# Patient Record
Sex: Female | Born: 1950 | Race: White | Hispanic: No | Smoking: Former smoker
Health system: Southern US, Community
[De-identification: ages and names within clinical notes are randomized; demographics above are authoritative.]

## PROBLEM LIST (undated history)

## (undated) DIAGNOSIS — I509 Heart failure, unspecified: Secondary | ICD-10-CM

## (undated) DIAGNOSIS — K7290 Hepatic failure, unspecified without coma: Secondary | ICD-10-CM

## (undated) DIAGNOSIS — J449 Chronic obstructive pulmonary disease, unspecified: Secondary | ICD-10-CM

## (undated) HISTORY — PX: TONSILLECTOMY: SUR1361

## (undated) HISTORY — PX: BLADDER SURGERY: SHX569

## (undated) HISTORY — PX: ABDOMINAL HYSTERECTOMY: SHX81

## (undated) HISTORY — PX: CHOLECYSTECTOMY: SHX55

## (undated) HISTORY — PX: BREAST SURGERY: SHX581

---

## 2018-04-21 ENCOUNTER — Encounter (HOSPITAL_BASED_OUTPATIENT_CLINIC_OR_DEPARTMENT_OTHER): Payer: Self-pay | Admitting: Emergency Medicine

## 2018-04-21 ENCOUNTER — Other Ambulatory Visit: Payer: Self-pay

## 2018-04-21 ENCOUNTER — Inpatient Hospital Stay (HOSPITAL_BASED_OUTPATIENT_CLINIC_OR_DEPARTMENT_OTHER)
Admission: EM | Admit: 2018-04-21 | Discharge: 2018-04-23 | DRG: 683 | Disposition: A | Discharge status: Home / Self Care | Payer: Managed Care, Other (non HMO) | Attending: Internal Medicine | Admitting: Internal Medicine

## 2018-04-21 ENCOUNTER — Emergency Department (HOSPITAL_BASED_OUTPATIENT_CLINIC_OR_DEPARTMENT_OTHER): Payer: Managed Care, Other (non HMO)

## 2018-04-21 DIAGNOSIS — I5032 Chronic diastolic (congestive) heart failure: Secondary | ICD-10-CM | POA: Diagnosis present

## 2018-04-21 DIAGNOSIS — R748 Abnormal levels of other serum enzymes: Secondary | ICD-10-CM

## 2018-04-21 DIAGNOSIS — J441 Chronic obstructive pulmonary disease with (acute) exacerbation: Secondary | ICD-10-CM

## 2018-04-21 DIAGNOSIS — D649 Anemia, unspecified: Secondary | ICD-10-CM | POA: Diagnosis present

## 2018-04-21 DIAGNOSIS — Z9049 Acquired absence of other specified parts of digestive tract: Secondary | ICD-10-CM

## 2018-04-21 DIAGNOSIS — Z90710 Acquired absence of both cervix and uterus: Secondary | ICD-10-CM | POA: Diagnosis not present

## 2018-04-21 DIAGNOSIS — Z9089 Acquired absence of other organs: Secondary | ICD-10-CM

## 2018-04-21 DIAGNOSIS — Z9981 Dependence on supplemental oxygen: Secondary | ICD-10-CM

## 2018-04-21 DIAGNOSIS — Z7951 Long term (current) use of inhaled steroids: Secondary | ICD-10-CM | POA: Diagnosis not present

## 2018-04-21 DIAGNOSIS — I5031 Acute diastolic (congestive) heart failure: Secondary | ICD-10-CM | POA: Diagnosis present

## 2018-04-21 DIAGNOSIS — Z87891 Personal history of nicotine dependence: Secondary | ICD-10-CM

## 2018-04-21 DIAGNOSIS — I110 Hypertensive heart disease with heart failure: Secondary | ICD-10-CM | POA: Diagnosis present

## 2018-04-21 DIAGNOSIS — K7460 Unspecified cirrhosis of liver: Secondary | ICD-10-CM | POA: Diagnosis present

## 2018-04-21 DIAGNOSIS — R21 Rash and other nonspecific skin eruption: Secondary | ICD-10-CM | POA: Diagnosis present

## 2018-04-21 DIAGNOSIS — I50814 Right heart failure due to left heart failure: Secondary | ICD-10-CM | POA: Diagnosis present

## 2018-04-21 DIAGNOSIS — J449 Chronic obstructive pulmonary disease, unspecified: Secondary | ICD-10-CM | POA: Diagnosis present

## 2018-04-21 DIAGNOSIS — L299 Pruritus, unspecified: Secondary | ICD-10-CM | POA: Diagnosis present

## 2018-04-21 DIAGNOSIS — E860 Dehydration: Secondary | ICD-10-CM | POA: Diagnosis present

## 2018-04-21 DIAGNOSIS — Z79899 Other long term (current) drug therapy: Secondary | ICD-10-CM

## 2018-04-21 DIAGNOSIS — N183 Chronic kidney disease, stage 3 unspecified: Secondary | ICD-10-CM | POA: Diagnosis present

## 2018-04-21 DIAGNOSIS — N179 Acute kidney failure, unspecified: Secondary | ICD-10-CM | POA: Diagnosis present

## 2018-04-21 DIAGNOSIS — R1010 Upper abdominal pain, unspecified: Secondary | ICD-10-CM

## 2018-04-21 DIAGNOSIS — M24549 Contracture, unspecified hand: Secondary | ICD-10-CM | POA: Diagnosis present

## 2018-04-21 DIAGNOSIS — K5730 Diverticulosis of large intestine without perforation or abscess without bleeding: Secondary | ICD-10-CM | POA: Diagnosis present

## 2018-04-21 DIAGNOSIS — Z888 Allergy status to other drugs, medicaments and biological substances status: Secondary | ICD-10-CM | POA: Diagnosis not present

## 2018-04-21 DIAGNOSIS — I272 Pulmonary hypertension, unspecified: Secondary | ICD-10-CM | POA: Diagnosis present

## 2018-04-21 DIAGNOSIS — K7581 Nonalcoholic steatohepatitis (NASH): Secondary | ICD-10-CM | POA: Diagnosis present

## 2018-04-21 DIAGNOSIS — K449 Diaphragmatic hernia without obstruction or gangrene: Secondary | ICD-10-CM | POA: Diagnosis present

## 2018-04-21 HISTORY — DX: Hepatic failure, unspecified without coma: K72.90

## 2018-04-21 HISTORY — DX: Chronic obstructive pulmonary disease, unspecified: J44.9

## 2018-04-21 HISTORY — DX: Heart failure, unspecified: I50.9

## 2018-04-21 LAB — COMPREHENSIVE METABOLIC PANEL
ALK PHOS: 92 U/L (ref 38–126)
ALT: 82 U/L — ABNORMAL HIGH (ref 0–44)
ANION GAP: 14 (ref 5–15)
AST: 96 U/L — ABNORMAL HIGH (ref 15–41)
Albumin: 4 g/dL (ref 3.5–5.0)
BUN: 26 mg/dL — ABNORMAL HIGH (ref 8–23)
CALCIUM: 10.2 mg/dL (ref 8.9–10.3)
CO2: 38 mmol/L — ABNORMAL HIGH (ref 22–32)
Chloride: 86 mmol/L — ABNORMAL LOW (ref 98–111)
Creatinine, Ser: 1.97 mg/dL — ABNORMAL HIGH (ref 0.44–1.00)
GFR calc non Af Amer: 25 mL/min — ABNORMAL LOW (ref >60)
GFR, EST AFRICAN AMERICAN: 30 mL/min — AB (ref >60)
Glucose, Bld: 121 mg/dL — ABNORMAL HIGH (ref 70–99)
Potassium: 3.5 mmol/L (ref 3.5–5.1)
Sodium: 138 mmol/L (ref 135–145)
Total Bilirubin: 1.1 mg/dL (ref 0.3–1.2)
Total Protein: 7.1 g/dL (ref 6.5–8.1)

## 2018-04-21 LAB — URINALYSIS, ROUTINE W REFLEX MICROSCOPIC
BILIRUBIN URINE: NEGATIVE
GLUCOSE, UA: NEGATIVE mg/dL
Ketones, ur: NEGATIVE mg/dL
Leukocytes, UA: NEGATIVE
Nitrite: NEGATIVE
PROTEIN: NEGATIVE mg/dL
Specific Gravity, Urine: 1.01 (ref 1.005–1.030)
pH: 7 (ref 5.0–8.0)

## 2018-04-21 LAB — PROTIME-INR
INR: 1.12
PROTHROMBIN TIME: 14.3 s (ref 11.4–15.2)

## 2018-04-21 LAB — CBC WITH DIFFERENTIAL/PLATELET
Abs Immature Granulocytes: 0.06 10*3/uL (ref 0.00–0.07)
Basophils Absolute: 0.1 10*3/uL (ref 0.0–0.1)
Basophils Relative: 1 %
EOS PCT: 13 %
Eosinophils Absolute: 1.2 10*3/uL — ABNORMAL HIGH (ref 0.0–0.5)
HEMATOCRIT: 35.8 % — AB (ref 36.0–46.0)
HEMOGLOBIN: 11.8 g/dL — AB (ref 12.0–15.0)
Immature Granulocytes: 1 %
LYMPHS ABS: 1 10*3/uL (ref 0.7–4.0)
LYMPHS PCT: 11 %
MCH: 33.3 pg (ref 26.0–34.0)
MCHC: 33 g/dL (ref 30.0–36.0)
MCV: 101.1 fL — ABNORMAL HIGH (ref 80.0–100.0)
MONO ABS: 0.5 10*3/uL (ref 0.1–1.0)
Monocytes Relative: 6 %
Neutro Abs: 6.4 10*3/uL (ref 1.7–7.7)
Neutrophils Relative %: 68 %
Platelets: 190 10*3/uL (ref 150–400)
RBC: 3.54 MIL/uL — AB (ref 3.87–5.11)
RDW: 13.4 % (ref 11.5–15.5)
WBC: 9.3 10*3/uL (ref 4.0–10.5)
nRBC: 0 % (ref 0.0–0.2)

## 2018-04-21 LAB — LIPASE, BLOOD: Lipase: 684 U/L — ABNORMAL HIGH (ref 11–51)

## 2018-04-21 LAB — MAGNESIUM: Magnesium: 1.8 mg/dL (ref 1.7–2.4)

## 2018-04-21 LAB — URINALYSIS, MICROSCOPIC (REFLEX)

## 2018-04-21 LAB — AMMONIA: AMMONIA: 35 umol/L (ref 9–35)

## 2018-04-21 MED ORDER — SODIUM CHLORIDE 0.9 % IV BOLUS
1000.0000 mL | Freq: Once | INTRAVENOUS | Status: AC
  Administered 2018-04-21: 1000 mL via INTRAVENOUS

## 2018-04-21 MED ORDER — DIPHENHYDRAMINE HCL 50 MG/ML IJ SOLN
25.0000 mg | Freq: Once | INTRAMUSCULAR | Status: AC
  Administered 2018-04-21: 25 mg via INTRAVENOUS
  Filled 2018-04-21: qty 1

## 2018-04-21 MED ORDER — ONDANSETRON HCL 4 MG PO TABS: 4.0000 mg | ORAL_TABLET | Freq: Four times a day (QID) | ORAL | Status: DC | PRN

## 2018-04-21 MED ORDER — ONDANSETRON HCL 4 MG/2ML IJ SOLN: 4.0000 mg | Freq: Four times a day (QID) | INTRAMUSCULAR | Status: DC | PRN

## 2018-04-21 MED ORDER — HYDROXYZINE HCL 25 MG PO TABS
25.0000 mg | ORAL_TABLET | Freq: Three times a day (TID) | ORAL | Status: DC | PRN
  Administered 2018-04-22 (×2): 25 mg via ORAL
  Filled 2018-04-21 (×2): qty 1

## 2018-04-21 MED ORDER — METOPROLOL TARTRATE 25 MG PO TABS
25.0000 mg | ORAL_TABLET | Freq: Every day | ORAL | Status: DC
  Administered 2018-04-22 – 2018-04-23 (×2): 25 mg via ORAL
  Filled 2018-04-21 (×3): qty 1

## 2018-04-21 MED ORDER — HYDROXYZINE HCL 10 MG PO TABS
10.0000 mg | ORAL_TABLET | Freq: Once | ORAL | Status: DC
  Filled 2018-04-21: qty 1

## 2018-04-21 MED ORDER — MOMETASONE FURO-FORMOTEROL FUM 200-5 MCG/ACT IN AERO
2.0000 | INHALATION_SPRAY | Freq: Two times a day (BID) | RESPIRATORY_TRACT | Status: DC
  Administered 2018-04-22 – 2018-04-23 (×3): 2 via RESPIRATORY_TRACT
  Filled 2018-04-21: qty 8.8

## 2018-04-21 MED ORDER — LACTATED RINGERS IV SOLN
INTRAVENOUS | Status: DC
  Administered 2018-04-21: 18:00:00 via INTRAVENOUS

## 2018-04-21 MED ORDER — GABAPENTIN 400 MG PO CAPS
400.0000 mg | ORAL_CAPSULE | Freq: Three times a day (TID) | ORAL | Status: DC
  Administered 2018-04-21 – 2018-04-23 (×5): 400 mg via ORAL
  Filled 2018-04-21 (×5): qty 1

## 2018-04-21 MED ORDER — UMECLIDINIUM BROMIDE 62.5 MCG/INH IN AEPB
1.0000 | INHALATION_SPRAY | Freq: Every day | RESPIRATORY_TRACT | Status: DC
  Administered 2018-04-22 – 2018-04-23 (×2): 1 via RESPIRATORY_TRACT
  Filled 2018-04-21: qty 7

## 2018-04-21 MED ORDER — HEPARIN SODIUM (PORCINE) 5000 UNIT/ML IJ SOLN
5000.0000 [IU] | Freq: Three times a day (TID) | INTRAMUSCULAR | Status: DC
  Administered 2018-04-21 – 2018-04-23 (×5): 5000 [IU] via SUBCUTANEOUS
  Filled 2018-04-21 (×5): qty 1

## 2018-04-21 MED ORDER — HYDROXYZINE HCL 25 MG PO TABS
25.0000 mg | ORAL_TABLET | Freq: Once | ORAL | Status: AC
  Administered 2018-04-21: 25 mg via ORAL
  Filled 2018-04-21: qty 1

## 2018-04-21 MED ORDER — SODIUM CHLORIDE 0.9 % IV SOLN
INTRAVENOUS | Status: DC
  Administered 2018-04-21: 23:00:00 via INTRAVENOUS

## 2018-04-21 NOTE — ED Triage Notes (Signed)
Rash on back x1 week with severe itching.  Saw pmd, has been using the usual allergic reaction meds and was starting to get better. For past 3 days it has re-ignited and has spread to other parts of body.

## 2018-04-21 NOTE — ED Provider Notes (Signed)
[facility] EMERGENCY DEPARTMENT Provider Note   CSN: [id number] Arrival date & time: 04/21/18  1152     History   Chief Complaint Chief Complaint  Patient presents with  . Rash    HPI Alyssa Mckenzie is a 67 y.o. female.  The history is provided by the patient and medical records. No language interpreter was used.  Abdominal Pain   This is a chronic problem. The current episode started more than 1 week ago. The problem occurs constantly. The problem has not changed since onset.The pain is associated with an unknown factor. The pain is located in the RUQ, epigastric region and LUQ. The quality of the pain is aching. The pain is moderate. Associated symptoms include nausea. Pertinent negatives include fever, diarrhea, vomiting, constipation, dysuria and headaches. The symptoms are aggravated by palpation. Nothing relieves the symptoms.    Past Medical History:  Diagnosis Date  . CHF (congestive heart failure) (HCC)   . COPD (chronic obstructive pulmonary disease) (HCC)   . Liver failure (HCC)     There are no active problems to display for this patient.   Past Surgical History:  Procedure Laterality Date  . ABDOMINAL HYSTERECTOMY    . BLADDER SURGERY    . BREAST SURGERY    . CHOLECYSTECTOMY    . TONSILLECTOMY       OB History   None      Home Medications    Prior to Admission medications   Medication Sig Start Date End Date Taking? Authorizing Provider  albuterol (PROVENTIL HFA;VENTOLIN HFA) 108 (90 Base) MCG/ACT inhaler Inhale into the lungs every 6 (six) hours as needed for wheezing or shortness of breath.   Yes [provider]  fluticasone-salmeterol (ADVAIR HFA) 115-21 MCG/ACT inhaler Inhale 2 puffs into the lungs 2 (two) times daily.   Yes [provider]  furosemide (LASIX) 20 MG tablet Take 20 mg by mouth daily.   Yes [provider]  gabapentin (NEURONTIN) 300 MG capsule Take 400 mg by mouth 3 (three) times daily.  600mg  at night   Yes [provider]  metoprolol tartrate (LOPRESSOR) 25 MG tablet Take 25 mg by mouth daily.   Yes [provider]  umeclidinium bromide (INCRUSE ELLIPTA) 62.5 MCG/INH AEPB Inhale 1 puff into the lungs daily.   Yes [provider]    Family History No family history on file.  Social History Social History   Tobacco Use  . Smoking status: Former Games developer  . Smokeless tobacco: Never Used  Substance Use Topics  . Alcohol use: Yes    Alcohol/week: 4.0 standard drinks    Types: 4 Glasses of wine per week    Comment: 4 glasses/day  . Drug use: Never     Allergies   Patient has no known allergies.   Review of Systems Review of Systems  Constitutional: Positive for fatigue. Negative for chills, diaphoresis and fever.  HENT: Negative for congestion.   Eyes: Negative for visual disturbance.  Respiratory: Negative for cough, chest tightness, shortness of breath and wheezing.   Cardiovascular: Negative for chest pain, palpitations and leg swelling.  Gastrointestinal: Positive for abdominal pain and nausea. Negative for abdominal distention, constipation, diarrhea and vomiting.  Genitourinary: Negative for dysuria and flank pain.  Musculoskeletal: Negative for back pain, neck pain and neck stiffness.  Skin: Positive for rash.       Profound itching   Neurological: Negative for light-headedness and headaches.  Psychiatric/Behavioral: Negative for agitation.  All other systems reviewed  and are negative.    Physical Exam Updated Vital Signs BP (!) 453/77 (BP Location: Right Arm)   Pulse 89   Temp 98.4 F (36.9 C) (Oral)   Resp 20   Ht 5\' 2"  (1.575 m)   Wt 62.1 kg   SpO2 100%   BMI 25.06 kg/m   Physical Exam  Constitutional: She is oriented to person, place, and time. She appears well-developed and well-nourished. No distress.  HENT:  Head: Normocephalic and atraumatic.  Mouth/Throat: Oropharynx is clear and moist. No  oropharyngeal exudate.  Eyes: Pupils are equal, round, and reactive to light. Conjunctivae and EOM are normal. No scleral icterus.  Neck: Normal range of motion. Neck supple.  Cardiovascular: Normal rate, regular rhythm and intact distal pulses.  No murmur heard. Pulmonary/Chest: Effort normal and breath sounds normal. No respiratory distress. She has no wheezes. She has no rales. She exhibits no tenderness.  Abdominal: Soft. There is tenderness in the right upper quadrant, epigastric area and left upper quadrant.    Musculoskeletal: She exhibits no edema or tenderness.  Neurological: She is alert and oriented to person, place, and time. No cranial nerve deficit or sensory deficit. She exhibits normal muscle tone. Coordination normal.  Skin: Skin is warm and dry. Capillary refill takes less than 2 seconds. Ecchymosis and rash noted. Rash is maculopapular. She is not diaphoretic. There is erythema.  Patient has diffuse maculopapular rash on her back and torso.  Bruising on her hands and forearms due to significant scratching and itching.  Psychiatric: She has a normal mood and affect.  Nursing note and vitals reviewed.    ED Treatments / Results  Labs (all labs ordered are listed, but only abnormal results are displayed) Labs Reviewed  CBC WITH DIFFERENTIAL/PLATELET - Abnormal; Notable for the following components:      Result Value   RBC 3.54 (*)    Hemoglobin 11.8 (*)    HCT 35.8 (*)    MCV 101.1 (*)    Eosinophils Absolute 1.2 (*)    All other components within normal limits  COMPREHENSIVE METABOLIC PANEL - Abnormal; Notable for the following components:   Chloride 86 (*)    CO2 38 (*)    Glucose, Bld 121 (*)    BUN 26 (*)    Creatinine, Ser 1.97 (*)    AST 96 (*)    ALT 82 (*)    GFR calc non Af Amer 25 (*)    GFR calc Af Amer 30 (*)    All other components within normal limits  URINALYSIS, ROUTINE W REFLEX MICROSCOPIC - Abnormal; Notable for the following components:    Color, Urine STRAW (*)    Hgb urine dipstick TRACE (*)    All other components within normal limits  LIPASE, BLOOD - Abnormal; Notable for the following components:   Lipase 684 (*)    All other components within normal limits  URINALYSIS, MICROSCOPIC (REFLEX) - Abnormal; Notable for the following components:   Bacteria, UA RARE (*)    All other components within normal limits  URINE CULTURE  AMMONIA  PROTIME-INR  MAGNESIUM    EKG None  Radiology No results found.  Procedures Procedures (including critical care time)  Medications Ordered in ED Medications  sodium chloride 0.9 % bolus 1,000 mL (1,000 mLs Intravenous New Bag/Given 04/21/18 1301)     Initial Impression / Assessment and Plan / ED Course  I have reviewed the triage vital signs and the nursing notes.  Pertinent labs &  imaging results that were available during my care of the patient were reviewed by me and considered in my medical decision making (see chart for details).     Debbe OdeaCynthia Howald is a [age] y.o. female with a reported past medical history significant for CHF, cirrhosis/liver failure, and COPD on home oxygen who presents with severe itching and diffuse rash.  Patient reports that she uses a TENS unit on her back as managed by her chiropractor.  She says that she has had irritation from this in the past but last Thursday, 6 days ago, she had onset of rash that was on her back.  She reports she saw her PCP who started her on a steroid taper of prednisone.  She also has been taking antihistamines.  She says that she is now down to the lowest dose of prednisone and over the last 24 hours her rashes continue to worsen.  She said is now spread all across her back and onto the rest of her torso.  She reports the itching is severe and she is scratching all over.  She thought yesterday her throat felt swollen but that has resolved. Mild nausea.  She denies any shortness of breath over her baseline.  She denies any cough  over baseline.  She denies vomiting, conservation, diarrhea, or urinary symptoms.  She reports similar abdominal discomfort.  Patient continues to drink wine.  She denies any significant relief from the antihistamines or steroids and her itching is incessant preventing her from sleeping.  On exam, patient has a maculopapular rash across her entire back that is blanching.  Patient also has varicose-like rash on her chest.  She has no significant tenderness.  Patient has focus of rash at the site of the TENS unit and she thinks it could be related to the sticky gel used.  She had lungs that were clear with no significant wheezing and is on her home oxygen.  Abdomen is slightly tender in the right upper quadrant.  No significant lower extremity edema seen.  Normal sensation in extremity's.  Patient has ecchymosis and bruising on the dorsums of both of her arms which she reports is due to severe scratching.  Initial differential includes allergic reaction due to the TENS unit adhesive that in the setting of continuing her prednisone taper has worsened.  However given her history of liver disease and continued drinking and kidney disease, itching could be due to metabolic imbalance.  Patient will have laboratory testing including liver function INR, ammonia, and urinalysis to look for kidney function.  She did not want chest x-ray as she reports she has had no new shortness breath or cough.  Patient was given some fluids as she feels her mouth is dry and she feels dehydrated.  Her legs are nonedematous and I do not hear crackles in the lungs, doubt CHF exacerbation.  If labs are reassuring, anticipate increasing steroids and follow-up with allergist and PCP.  If labs are concerning, will consider further management.  2:10 PM Diagnostic labs begin to return.  Urinalysis is no evidence of urinary tract infection no nitrites or leukocytes.  CBC shows no leukocytosis.  Mild anemia.  Normal platelets.  Metabolic  panel was highly concerning with elevated creatinine of 1.97.  No prior creatinine to compare recently.  Also concerning is patient's AST and ALT that are elevated and the lipase is 684.  When asked, family and patient do not know if she has ever had lipase checked.  Given the elevated LFTs  and the lipase elevation, a cholestasis or obstructive etiology could be the cause of both her itching and the upper abdominal discomfort that has been persistent.    Patient's kidney function is elevated and she cannot get a contrasted scan so we will do an ultrasound to look for sludge, stone, or cholecystitis.  If ultrasound is negative, will consider CT without contrast to further evaluate.  If patient is having pancreatitis causing dehydration and AKI and profound itching, patient may require admission for rehydration and further management.  If imaging is all reassuring and patient feels comfortable, patient may be discharged home with increase in steroids as her itching may be related to the rash on her back.  Care transferred to Dr. Jacqulyn Bath while awaiting results of ultrasound.  Anticipate reassessment after work-up.   Final Clinical Impressions(s) / ED Diagnoses   Final diagnoses:  Upper abdominal pain  Rash  Pruritus  Elevated lipase     Clinical Impression: 1. Rash   2. Upper abdominal pain   3. Pruritus   4. Elevated lipase     Disposition: Care transferred to Dr. Jacqulyn Bath while awaiting results of ultrasound and rehydration.  This note was prepared with assistance of Conservation officer, historic buildings. Occasional wrong-word or sound-a-like substitutions may have occurred due to the inherent limitations of voice recognition software.     Adiva Boettner, Canary Brim, MD 04/21/18 [phone]

## 2018-04-21 NOTE — H&P (Signed)
History and Physical   Alyssa OdeaCynthia Kundrat JYN:[id number] DOB: June 18, 1950 DOA: 04/21/2018  Referring MD/NP/PA: Dr. Jacqulyn BathLong  PCP: Patrcia DollyMyers, Daniel Lewis, DO   Outpatient Specialists: [facility]  Patient coming from: [facility]  Chief Complaint: Itching and generalized weakness  HPI: Alyssa Mckenzie is a 67 y.o. 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  She went to see her primary care physician a few days ago with the same problem.  While there she reported using some TENS unit for her chronic back pain and started feeling the itching after she removed the TENS unit.  She noticed a lot of rashes going down her body which are raised and blanching.  Associated with that is her increasing shortness of breath and abdominal pain.  Her initial evaluation showed worsening renal function with a lipase of more than 600 per subsequent abdominal ultrasound and CT scan showed no evidence of pancreatitis.  Patient denied any recent nausea vomiting or diarrhea.  She is being admitted primarily because of the worsening renal function and to be evaluated for worsening itching.  ED Course: Temperature is 98 for initial blood pressure 124/54 her pulse is 91 respiratory 21 oxygen sat 100%.  Sodium 138 potassium 3.5 chloride 86 with a CO2 38 and glucose 121.  Creatinine is 1.97 calcium 10.2 with a gap of 14.  Her lipase is 684.  AST 96 ALT 82 total protein 7.1 albumin is 4.0 and total bilirubin 1.1.  Hemoglobin 11.8 and platelets 190.  Urinalysis is negative.  CT abdomen pelvis showed hepatic cirrhosis and diffuse hepatic steatosis.  No other significant findings except for diverticulosis and hiatal hernia.  Abdominal ultrasound also showed status post cholecystectomy otherwise  negative.  Review of Systems: As per HPI otherwise 10 point review of systems negative.    Past Medical History:  Diagnosis Date  . CHF (congestive heart failure) (HCC)   . COPD (chronic obstructive pulmonary disease) (HCC)   . Liver failure [facility](HCC)     Past Surgical History:  Procedure Laterality Date  . ABDOMINAL HYSTERECTOMY    . BLADDER SURGERY    . BREAST SURGERY    . CHOLECYSTECTOMY    . TONSILLECTOMY       reports that she has quit smoking. She has never used smokeless tobacco. She reports that she drinks about 4.0 standard drinks of alcohol per week. She reports that she does not use drugs.  No Known Allergies  No family history on file.   Prior to Admission medications   Medication Sig Start Date End Date Taking? Authorizing Provider  albuterol (PROVENTIL HFA;VENTOLIN HFA) 108 (90 Base) MCG/ACT inhaler Inhale into the lungs every 6 (six) hours as needed for wheezing or shortness of breath.   Yes [provider]  fluticasone-salmeterol (ADVAIR HFA) 115-21 MCG/ACT inhaler Inhale 2 puffs into the lungs 2 (two) times daily.   Yes [provider]  furosemide (LASIX) 20 MG tablet Take 20 mg by mouth daily.   Yes [provider]  gabapentin (NEURONTIN) 300 MG capsule Take 400 mg by mouth 3 (three) times daily. 600mg  at night   Yes [provider]  metoprolol tartrate (LOPRESSOR) 25 MG tablet Take 25 mg by mouth daily.   Yes [provider]  umeclidinium bromide (INCRUSE ELLIPTA) 62.5 MCG/INH AEPB Inhale 1 puff into  the lungs daily.   Yes [provider]    Physical Exam: Vitals:   04/21/18 1912 04/21/18 1945 04/21/18 2055 04/21/18 2056  BP:  125/60 (!) 143/64   Pulse:  91 79   Resp: 18 (!) 21 16   Temp:   98 F (36.7 C)   TempSrc:   Oral   SpO2:  95% 100%   Weight:    60.7 kg  Height:    5\' 2"  (1.575 m)      Constitutional: NAD, calm, comfortable, appears stated age Vitals:   04/21/18 1912 04/21/18 1945  04/21/18 2055 04/21/18 2056  BP:  125/60 (!) 143/64   Pulse:  91 79   Resp: 18 (!) 21 16   Temp:   98 F (36.7 C)   TempSrc:   Oral   SpO2:  95% 100%   Weight:    60.7 kg  Height:    5\' 2"  (1.575 m)   Eyes: PERRL, lids and conjunctivae normal ENMT: Mucous membranes are moist. Posterior pharynx clear of any exudate or lesions.Normal dentition.  Neck: normal, supple, no masses, no thyromegaly Respiratory: clear to auscultation bilaterally, no wheezing, no crackles. Normal respiratory effort. No accessory muscle use.  Cardiovascular: Regular rate and rhythm, no murmurs / rubs / gallops. No extremity edema. 2+ pedal pulses. No carotid bruits.  Abdomen: Full and mildly distended, no tenderness, no masses palpated. No hepatosplenomegaly. Bowel sounds positive.  Musculoskeletal: no clubbing / cyanosis. No joint deformity upper and lower extremities. Good ROM, some hand contractures. Normal muscle tone.  Skin: Multiple punctate rashes all over the trunk as well as bruises on the arms, lesions, ulcers. No induration Neurologic: CN 2-12 grossly intact. Sensation intact, DTR normal. Strength 5/5 in all 4.  Psychiatric: Normal judgment and insight. Alert and oriented x 3. Normal mood.     Labs on Admission: I have personally reviewed following labs and imaging studies  CBC: Recent Labs  Lab 04/21/18 1243  WBC 9.3  NEUTROABS 6.4  HGB 11.8*  HCT 35.8*  MCV 101.1*  PLT 190   Basic Metabolic Panel: Recent Labs  Lab 04/21/18 1243  NA 138  K 3.5  CL 86*  CO2 38*  GLUCOSE 121*  BUN 26*  CREATININE 1.97*  CALCIUM 10.2  MG 1.8   GFR: Estimated Creatinine Clearance: 24.4 mL/min (A) (by C-G formula based on SCr of 1.97 mg/dL (H)). Liver Function Tests: Recent Labs  Lab 04/21/18 1243  AST 96*  ALT 82*  ALKPHOS 92  BILITOT 1.1  PROT 7.1  ALBUMIN 4.0   Recent Labs  Lab 04/21/18 1243  LIPASE 684*   Recent Labs  Lab 04/21/18 1243  AMMONIA 35   Coagulation  Profile: Recent Labs  Lab 04/21/18 1243  INR 1.12   Cardiac Enzymes: No results for input(s): CKTOTAL, CKMB, CKMBINDEX, TROPONINI in the last 168 hours. BNP (last 3 results) No results for input(s): PROBNP in the last 8760 hours. HbA1C: No results for input(s): HGBA1C in the last 72 hours. CBG: No results for input(s): GLUCAP in the last 168 hours. Lipid Profile: No results for input(s): CHOL, HDL, LDLCALC, TRIG, CHOLHDL, LDLDIRECT in the last 72 hours. Thyroid Function Tests: No results for input(s): TSH, T4TOTAL, FREET4, T3FREE, THYROIDAB in the last 72 hours. Anemia Panel: No results for input(s): VITAMINB12, FOLATE, FERRITIN, TIBC, IRON, RETICCTPCT in the last 72 hours. Urine analysis:    Component Value Date/Time   COLORURINE STRAW (A) 04/21/2018 1302   APPEARANCEUR CLEAR 04/21/2018  1302   LABSPEC 1.010 04/21/2018 1302   PHURINE 7.0 04/21/2018 1302   GLUCOSEU NEGATIVE 04/21/2018 1302   HGBUR TRACE (A) 04/21/2018 1302   BILIRUBINUR NEGATIVE 04/21/2018 1302   KETONESUR NEGATIVE 04/21/2018 1302   PROTEINUR NEGATIVE 04/21/2018 1302   NITRITE NEGATIVE 04/21/2018 1302   LEUKOCYTESUR NEGATIVE 04/21/2018 1302   Sepsis Labs: @LABRCNTIP (procalcitonin:4,lacticidven:4) )No results found for this or any previous visit (from the past 240 hour(s)).   Radiological Exams on Admission: Ct Abdomen Pelvis Wo Contrast  Result Date: 04/21/2018 CLINICAL DATA:  Nausea and vomiting.  Upper abdominal pain. EXAM: CT ABDOMEN AND PELVIS WITHOUT CONTRAST TECHNIQUE: Multidetector CT imaging of the abdomen and pelvis was performed following the standard protocol without IV contrast. COMPARISON:  None. FINDINGS: Lower chest: No significant pulmonary nodules or acute consolidative airspace disease. Centrilobular emphysema at the lung bases. Hepatobiliary: Diffuse hepatic steatosis. Heterogeneous liver parenchyma and finely irregular liver surface, compatible with hepatic cirrhosis. No discrete liver  mass. Cholecystectomy. No biliary ductal dilatation. Pancreas: Normal, with no mass or duct dilation. Spleen: Normal size. No mass. Adrenals/Urinary Tract: Normal adrenals. No hydronephrosis. No renal stones. No contour deforming renal masses. Normal bladder. Stomach/Bowel: Small hiatal hernia. Otherwise normal nondistended stomach. Normal caliber small bowel with no small bowel wall thickening. Normal appendix. Moderate sigmoid diverticulosis, with no large bowel wall thickening or significant pericolonic fat stranding. Vascular/Lymphatic: Atherosclerotic nonaneurysmal abdominal aorta. No pathologically enlarged lymph nodes in the abdomen or pelvis. Reproductive: Status post hysterectomy, with no abnormal findings at the vaginal cuff. No adnexal mass. Other: No pneumoperitoneum, ascites or focal fluid collection. Musculoskeletal: No aggressive appearing focal osseous lesions. Moderate lumbar spondylosis. IMPRESSION: 1. Hepatic cirrhosis and diffuse hepatic steatosis. 2. No evidence of bowel obstruction or acute bowel inflammation. Moderate sigmoid diverticulosis, with no evidence of acute diverticulitis. 3. Small hiatal hernia. 4. Aortic Atherosclerosis (ICD10-I70.0) and Emphysema (ICD10-J43.9). Electronically Signed   By: Delbert Phenix M.D.   On: 04/21/2018 17:20   US Abdomen Limited Ruq  Result Date: 04/21/2018 CLINICAL DATA:  Upper abdominal pain, elevated LFTs and lipase, history CHF, COPD, liver failure, former smoker, history of gallstones and prior cholecystectomy EXAM: ULTRASOUND ABDOMEN LIMITED RIGHT UPPER QUADRANT COMPARISON:  None FINDINGS: Gallbladder: Surgically absent Common bile duct: Diameter: 4 mm diameter, normal Liver: Heterogeneous increased hepatic echogenicity, question fatty infiltration though this can be seen with cirrhosis and certain infiltrative disorders. Hepatic margins appear smooth without irregularity or nodularity. No definite focal hepatic mass lesion identified. Portal vein  is patent on color Doppler imaging with normal direction of blood flow towards the liver. No RIGHT upper quadrant free fluid. IMPRESSION: Post cholecystectomy. Question fatty infiltration of liver as above. Electronically Signed   By: Ulyses Southward M.D.   On: 04/21/2018 16:17    Assessment/Plan Principal Problem:   ARF (acute renal failure) (HCC) Active Problems:   COPD exacerbation (HCC)   Acute diastolic HF (heart failure) (HCC)   Liver cirrhosis (HCC)   Itching     #1 acute kidney injury: Patient's GFR and creatinine have changed within a few days.  Previous creatinine was 0.8 and currently 1.97 with a GFR now down to 25 but was 60 about a week ago.  It appears to be prerenal based on her profile.  Did not change any medications.  We will therefore admit the patient.  Gently hydrate her and stop her Lasix.  She has been taking Lasix on a daily basis.  Monitor her response and avoid fluid overload.  If renal  function does not improve we may require nephrology input.  #2 itching with rashes: Probably related to liver cirrhosis.  Patient has no new medications.  I do not think using the TENS unit had anything to do with it.  We will treated symptomatically.  Start patient on some Atarax p.o.  Cholestyramine may be added to see if that would help.  She has already tried steroids in the outpatient setting with minimal help.  #3 COPD: No obvious exacerbation.  On home O2.  Continue home regimen.  #4 liver cirrhosis: Appears to be nonalcoholic steatosis which has been present for a while.  Elevation in LFTs will therefore be secondary to these.  Continue supportive care.  #5 diastolic heart failure: Patient apparently has been diagnosed with right-sided heart failure many years ago but has not really been followed up for that.  I will order an echocardiogram.   DVT prophylaxis: Heparin Code Status: Full code Family Communication: No family currently available Disposition Plan: Home Consults  called: None at the moment Admission status: Inpatient  Severity of Illness: The appropriate patient status for this patient is INPATIENT. Inpatient status is judged to be reasonable and necessary in order to provide the required intensity of service to ensure the patient's safety. The patient's presenting symptoms, physical exam findings, and initial radiographic and laboratory data in the context of their chronic comorbidities is felt to place them at high risk for further clinical deterioration. Furthermore, it is not anticipated that the patient will be medically stable for discharge from the hospital within 2 midnights of admission. The following factors support the patient status of inpatient.   " The patient's presenting symptoms include abdominal pain and itching. " The worrisome physical exam findings include overall skin rashes. " The initial radiographic and laboratory data are worrisome because of worsening renal function. " The chronic co-morbidities include liver cirrhosis and COPD.   * I certify that at the point of admission it is my clinical judgment that the patient will require inpatient hospital care spanning beyond 2 midnights from the point of admission due to high intensity of service, high risk for further deterioration and high frequency of surveillance required.Lonia Blood MD [facility] Pager [phone]  If 7PM-7AM, please contact night-coverage [email or web address] Password [email or web address]  04/21/2018, 10:02 PM

## 2018-04-21 NOTE — ED Provider Notes (Signed)
Blood pressure (!) 147/71, pulse 79, temperature 98.4 F (36.9 C), temperature source Oral, resp. rate 20, height 5\' 2"  (1.575 m), weight 62.1 kg, SpO2 100 %.  Assuming care from Dr. Rush Landmarkegeler.  In short, Debbe OdeaCynthia Leonhardt is a 67 y.o. female with a chief complaint of Rash .  Refer to the original H&P for additional details.  The current plan of care is to f/u RUQ US and labs.  5:28 PM Patient presents with epigastric abdominal pain with severe itching and rash.  Labs show elevated lipase to 600 and worsening kidney function.  GFR now in the mid 20s.  Last week was in the upper 40s to 50s.  CT imaging and right upper quadrant ultrasound did not show any acute biliary process.  Patient does have significant liver disease likely secondary to cirrhosis.  Given her known pathology and worsening kidney function I feel she would benefit from observational admission for IV fluids and possibly nephrology evaluation.   06:21 PM Discussed patient's case with Hospitalist, Dr. Mikeal HawthorneGarba to request admission. Patient and family (if present) updated with plan. Care transferred to Hospitalist service.  I reviewed all nursing notes, vitals, pertinent old records, EKGs, labs, imaging (as available).    Maia PlanLong, Nahsir Venezia G, MD 04/21/18 [id number]

## 2018-04-21 NOTE — ED Notes (Signed)
ED Provider at bedside. 

## 2018-04-22 ENCOUNTER — Inpatient Hospital Stay (HOSPITAL_COMMUNITY): Payer: Managed Care, Other (non HMO)

## 2018-04-22 DIAGNOSIS — R21 Rash and other nonspecific skin eruption: Secondary | ICD-10-CM

## 2018-04-22 DIAGNOSIS — K7460 Unspecified cirrhosis of liver: Secondary | ICD-10-CM

## 2018-04-22 LAB — CBC
HEMATOCRIT: 33.2 % — AB (ref 36.0–46.0)
Hemoglobin: 10.7 g/dL — ABNORMAL LOW (ref 12.0–15.0)
MCH: 34.3 pg — AB (ref 26.0–34.0)
MCHC: 32.2 g/dL (ref 30.0–36.0)
MCV: 106.4 fL — AB (ref 80.0–100.0)
Platelets: 153 10*3/uL (ref 150–400)
RBC: 3.12 MIL/uL — ABNORMAL LOW (ref 3.87–5.11)
RDW: 13.6 % (ref 11.5–15.5)
WBC: 8.2 10*3/uL (ref 4.0–10.5)
nRBC: 0 % (ref 0.0–0.2)

## 2018-04-22 LAB — COMPREHENSIVE METABOLIC PANEL
ALT: 67 U/L — AB (ref 0–44)
AST: 58 U/L — ABNORMAL HIGH (ref 15–41)
Albumin: 3.7 g/dL (ref 3.5–5.0)
Alkaline Phosphatase: 75 U/L (ref 38–126)
Anion gap: 10 (ref 5–15)
BILIRUBIN TOTAL: 1 mg/dL (ref 0.3–1.2)
BUN: 29 mg/dL — AB (ref 8–23)
CALCIUM: 9.7 mg/dL (ref 8.9–10.3)
CO2: 37 mmol/L — AB (ref 22–32)
CREATININE: 1.25 mg/dL — AB (ref 0.44–1.00)
Chloride: 93 mmol/L — ABNORMAL LOW (ref 98–111)
GFR calc non Af Amer: 44 mL/min — ABNORMAL LOW (ref >60)
GFR, EST AFRICAN AMERICAN: 51 mL/min — AB (ref >60)
GLUCOSE: 88 mg/dL (ref 70–99)
Potassium: 2.7 mmol/L — CL (ref 3.5–5.1)
Sodium: 140 mmol/L (ref 135–145)
Total Protein: 6.3 g/dL — ABNORMAL LOW (ref 6.5–8.1)

## 2018-04-22 LAB — URINE CULTURE

## 2018-04-22 LAB — HIV ANTIBODY (ROUTINE TESTING W REFLEX): HIV Screen 4th Generation wRfx: NONREACTIVE

## 2018-04-22 MED ORDER — POTASSIUM CHLORIDE CRYS ER 20 MEQ PO TBCR
40.0000 meq | EXTENDED_RELEASE_TABLET | Freq: Two times a day (BID) | ORAL | Status: AC
  Administered 2018-04-22 (×2): 40 meq via ORAL
  Filled 2018-04-22 (×2): qty 2

## 2018-04-22 MED ORDER — CHOLESTYRAMINE LIGHT 4 G PO PACK
4.0000 g | PACK | Freq: Two times a day (BID) | ORAL | Status: DC
  Administered 2018-04-22 (×2): 4 g via ORAL
  Filled 2018-04-22 (×4): qty 1

## 2018-04-22 MED ORDER — SODIUM CHLORIDE 0.9 % IV SOLN
INTRAVENOUS | Status: DC
  Administered 2018-04-22: 19:00:00 via INTRAVENOUS

## 2018-04-22 MED ORDER — CAMPHOR-MENTHOL 0.5-0.5 % EX LOTN
TOPICAL_LOTION | CUTANEOUS | Status: DC | PRN
  Filled 2018-04-22: qty 222

## 2018-04-22 MED ORDER — HYDROXYZINE HCL 25 MG PO TABS
50.0000 mg | ORAL_TABLET | ORAL | Status: DC | PRN
  Administered 2018-04-22 – 2018-04-23 (×4): 50 mg via ORAL
  Filled 2018-04-22 (×4): qty 2

## 2018-04-22 MED ORDER — HYDROXYZINE HCL 25 MG PO TABS: 25.0000 mg | ORAL_TABLET | ORAL | Status: DC | PRN

## 2018-04-22 NOTE — Progress Notes (Signed)
CRITICAL VALUE ALERT  Critical Value:  K 2.7  Date & Time Notied:  11/20 0729  Provider Notified: David StallFeliz Ortiz  Orders Received/Actions taken: 40mEq oral potassium BID

## 2018-04-22 NOTE — Progress Notes (Signed)
[facility] PROGRESS NOTE    Progress Note  Alyssa Mckenzie  PIR:[id number] DOB: December 18, 1950 DOA: 04/21/2018 PCP: No primary care provider on file.     Brief Narrative:   Alyssa Mckenzie is an 67 y.o. female past medical history significant for advanced COPD on oxygen, known alcoholic hepatic steatosis with cirrhosis, chronic diastolic heart failure, with pulmonary hypertension who presented with him to [facility] with worsening rash and itching of her skin.  Assessment/Plan:   AKI: Baseline creatinine around 0.8, on admission 1.9. Likely prerenal azotemia, improving with IV fluid hydration.  Itching with rash: Question due to cirrhosis. Started on  Atarax,  will start oral cholestyramine. As an appointment with a allergist on December 2. She relates she was on prednisone with no improvement. She has remained afebrile with no leukocytosis. His itching is improved, can be discharged and follow-up with allergist as an outpatient. Please Benadryl makes it worse. She did finish a steroid taper with no improvement.  COPD exacerbation (HCC) Continue home oxygen seems to be stable.  Liver cirrhosis: Appears to be [place] elevation in LFTs likely due to [place].  Chronic diastolic heart failure: With right-sided heart failure   Acute diastolic HF (heart failure) (HCC)  Malnutrition Type:      Malnutrition Characteristics:      Nutrition Interventions:       DVT prophylaxis: lovenox Family Communication:none Disposition Plan/Barrier to D/C: home in am Code Status:     Code Status Orders  (From admission, onward)         Start     Ordered   04/21/18 2201  Full code  Continuous     04/21/18 2202        Code Status History    This patient has a current code status but no historical code status.    Advance Directive Documentation     Most Recent Value  Type of Advance Directive  Healthcare Power of Attorney, Living will  Pre-existing out of  facility DNR order (yellow form or pink MOST form)  -  "MOST" Form in Place?  -        IV Access:    Peripheral IV   Procedures and diagnostic studies:   Ct Abdomen Pelvis Wo Contrast  Result Date: 04/21/2018 CLINICAL DATA:  Nausea and vomiting.  Upper abdominal pain. EXAM: CT ABDOMEN AND PELVIS WITHOUT CONTRAST TECHNIQUE: Multidetector CT imaging of the abdomen and pelvis was performed following the standard protocol without IV contrast. COMPARISON:  None. FINDINGS: Lower chest: No significant pulmonary nodules or acute consolidative airspace disease. Centrilobular emphysema at the lung bases. Hepatobiliary: Diffuse hepatic steatosis. Heterogeneous liver parenchyma and finely irregular liver surface, compatible with hepatic cirrhosis. No discrete liver mass. Cholecystectomy. No biliary ductal dilatation. Pancreas: Normal, with no mass or duct dilation. Spleen: Normal size. No mass. Adrenals/Urinary Tract: Normal adrenals. No hydronephrosis. No renal stones. No contour deforming renal masses. Normal bladder. Stomach/Bowel: Small hiatal hernia. Otherwise normal nondistended stomach. Normal caliber small bowel with no small bowel wall thickening. Normal appendix. Moderate sigmoid diverticulosis, with no large bowel wall thickening or significant pericolonic fat stranding. Vascular/Lymphatic: Atherosclerotic nonaneurysmal abdominal aorta. No pathologically enlarged lymph nodes in the abdomen or pelvis. Reproductive: Status post hysterectomy, with no abnormal findings at the vaginal cuff. No adnexal mass. Other: No pneumoperitoneum, ascites or focal fluid collection. Musculoskeletal: No aggressive appearing focal osseous lesions. Moderate lumbar spondylosis. IMPRESSION: 1. Hepatic cirrhosis and diffuse hepatic steatosis. 2. No evidence of bowel obstruction or acute bowel  inflammation. Moderate sigmoid diverticulosis, with no evidence of acute diverticulitis. 3. Small hiatal hernia. 4. Aortic  Atherosclerosis (ICD10-I70.0) and Emphysema (ICD10-J43.9). Electronically Signed   By: Delbert Phenix M.D.   On: 04/21/2018 17:20   US Abdomen Limited Ruq  Result Date: 04/21/2018 CLINICAL DATA:  Upper abdominal pain, elevated LFTs and lipase, history CHF, COPD, liver failure, former smoker, history of gallstones and prior cholecystectomy EXAM: ULTRASOUND ABDOMEN LIMITED RIGHT UPPER QUADRANT COMPARISON:  None FINDINGS: Gallbladder: Surgically absent Common bile duct: Diameter: 4 mm diameter, normal Liver: Heterogeneous increased hepatic echogenicity, question fatty infiltration though this can be seen with cirrhosis and certain infiltrative disorders. Hepatic margins appear smooth without irregularity or nodularity. No definite focal hepatic mass lesion identified. Portal vein is patent on color Doppler imaging with normal direction of blood flow towards the liver. No RIGHT upper quadrant free fluid. IMPRESSION: Post cholecystectomy. Question fatty infiltration of liver as above. Electronically Signed   By: Ulyses Southward M.D.   On: 04/21/2018 16:17     Medical Consultants:    None.  Anti-Infectives:   None  Subjective:    Alyssa Mckenzie she relates her itching is not improved.  Objective:    Vitals:   04/21/18 1945 04/21/18 2055 04/21/18 2056 04/22/18 0538  BP: 125/60 (!) 143/64  (!) 117/54  Pulse: 91 79  78  Resp: (!) 21 16  14   Temp:  98 F (36.7 C)  97.9 F (36.6 C)  TempSrc:  Oral  Oral  SpO2: 95% 100%  94%  Weight:   60.7 kg   Height:   5\' 2"  (1.575 m)     Intake/Output Summary (Last 24 hours) at 04/22/2018 0823 Last data filed at 04/22/2018 0600 Gross per 24 hour  Intake 721.7 ml  Output -  Net 721.7 ml   Filed Weights   04/21/18 1156 04/21/18 2056  Weight: 62.1 kg 60.7 kg    Exam: General exam: In no acute distress. Respiratory system: Good air movement and clear to auscultation. Cardiovascular system: S1 & S2 heard, RRR. Gastrointestinal system: Abdomen is  nondistended, soft and nontender.  Central nervous system: Alert and oriented. No focal neurological deficits. Extremities: No pedal edema. Skin: She has a blanchable's rash that started about 4 days prior to admission.  Psychiatry: Judgement and insight appear normal. Mood & affect appropriate.    Data Reviewed:    Labs: Basic Metabolic Panel: Recent Labs  Lab 04/21/18 1243 04/22/18 0605  NA 138 140  K 3.5 2.7*  CL 86* 93*  CO2 38* 37*  GLUCOSE 121* 88  BUN 26* 29*  CREATININE 1.97* 1.25*  CALCIUM 10.2 9.7  MG 1.8  --    GFR Estimated Creatinine Clearance: 38.5 mL/min (A) (by C-G formula based on SCr of 1.25 mg/dL (H)). Liver Function Tests: Recent Labs  Lab 04/21/18 1243 04/22/18 0605  AST 96* 58*  ALT 82* 67*  ALKPHOS 92 75  BILITOT 1.1 1.0  PROT 7.1 6.3*  ALBUMIN 4.0 3.7   Recent Labs  Lab 04/21/18 1243  LIPASE 684*   Recent Labs  Lab 04/21/18 1243  AMMONIA 35   Coagulation profile Recent Labs  Lab 04/21/18 1243  INR 1.12    CBC: Recent Labs  Lab 04/21/18 1243 04/22/18 0605  WBC 9.3 8.2  NEUTROABS 6.4  --   HGB 11.8* 10.7*  HCT 35.8* 33.2*  MCV 101.1* 106.4*  PLT 190 153   Cardiac Enzymes: No results for input(s): CKTOTAL, CKMB, CKMBINDEX, TROPONINI in the  last 168 hours. BNP (last 3 results) No results for input(s): PROBNP in the last 8760 hours. CBG: No results for input(s): GLUCAP in the last 168 hours. D-Dimer: No results for input(s): DDIMER in the last 72 hours. Hgb A1c: No results for input(s): HGBA1C in the last 72 hours. Lipid Profile: No results for input(s): CHOL, HDL, LDLCALC, TRIG, CHOLHDL, LDLDIRECT in the last 72 hours. Thyroid function studies: No results for input(s): TSH, T4TOTAL, T3FREE, THYROIDAB in the last 72 hours.  Invalid input(s): FREET3 Anemia work up: No results for input(s): VITAMINB12, FOLATE, FERRITIN, TIBC, IRON, RETICCTPCT in the last 72 hours. Sepsis Labs: Recent Labs  Lab 04/21/18 1243  04/22/18 0605  WBC 9.3 8.2   Microbiology No results found for this or any previous visit (from the past 240 hour(s)).   Medications:   . gabapentin  400 mg Oral TID  . heparin  5,000 Units Subcutaneous Q8H  . metoprolol tartrate  25 mg Oral Daily  . mometasone-formoterol  2 puff Inhalation BID  . potassium chloride  40 mEq Oral BID  . umeclidinium bromide  1 puff Inhalation Daily   Continuous Infusions: . sodium chloride 75 mL/hr at 04/21/18 2327  . lactated ringers Stopped (04/21/18 1951)       LOS: 1 day   Marinda Elk  [facility]   *Please refer to [email or web address], password [email or web address] to get updated schedule on who will round on this patient, as hospitalists switch teams weekly. If 7PM-7AM, please contact night-coverage at [email or web address], password [email or web address] for any overnight needs.  04/22/2018, 8:23 AM

## 2018-04-23 LAB — BASIC METABOLIC PANEL
Anion gap: 9 (ref 5–15)
BUN: 19 mg/dL (ref 8–23)
CO2: 29 mmol/L (ref 22–32)
CREATININE: 1.07 mg/dL — AB (ref 0.44–1.00)
Calcium: 8.8 mg/dL — ABNORMAL LOW (ref 8.9–10.3)
Chloride: 105 mmol/L (ref 98–111)
GFR calc non Af Amer: 53 mL/min — ABNORMAL LOW (ref >60)
Glucose, Bld: 108 mg/dL — ABNORMAL HIGH (ref 70–99)
POTASSIUM: 4.3 mmol/L (ref 3.5–5.1)
Sodium: 143 mmol/L (ref 135–145)

## 2018-04-23 MED ORDER — HYDROXYZINE HCL 50 MG PO TABS: 50.0000 mg | ORAL_TABLET | ORAL | 0 refills | Status: AC | PRN

## 2018-04-23 NOTE — Discharge Summary (Signed)
Physician Discharge Summary  Alyssa OdeaCynthia Mckenzie AVW:[id number] DOB: [birth year] DOA: 04/21/2018  PCP: No primary care provider on file.  Admit date: 04/21/2018 Discharge date: 04/23/2018  Admitted From: Home Disposition:  Home  Recommendations for Outpatient Follow-up:  1. She will follow-up with an allergy specialist on May 04, 2018.  She has already an appointment.  Home Health:No Equipment/Devices:none  Discharge Condition:stable CODE STATUS:full Diet recommendation: Heart Healthy   Brief/Interim Summary: 67 y.o. female past medical history significant for advanced COPD on oxygen, known alcoholic hepatic steatosis with cirrhosis, chronic diastolic heart failure, with pulmonary hypertension who [facility] with worsening rash and itching of her skin.  Discharge Diagnoses:  Principal Problem:   ARF (acute renal failure) (HCC) Active Problems:   COPD exacerbation (HCC)   Acute diastolic HF (heart failure) (HCC)   Liver cirrhosis (HCC)   Itching Acute kidney injury: With a baseline around 0.8 on admission 1.9, likely prerenal azotemia resolved with IV fluid hydration.  Itching with rash: Question due to cirrhosis, she was started on Atarax and cholestyramine her itching improved. She has an appointment with an allergist on December 2. She remained stable.  COPD/chronic respiratory failure: Continue current home oxygen.  Liver cirrhosis: Likely due to [place].  Chronic diastolic heart failure with right-sided heart failure Changes made to her medication. Discharge Instructions  Discharge Instructions    Diet - low sodium heart healthy   Complete by:  As directed    Increase activity slowly   Complete by:  As directed      Allergies as of 04/23/2018      Reactions   Statins Other (See Comments)   aching      Medication List    TAKE these medications   albuterol 108 (90 Base) MCG/ACT inhaler Commonly known as:  PROVENTIL  HFA;VENTOLIN HFA Inhale into the lungs every 6 (six) hours as needed for wheezing or shortness of breath.   fluticasone-salmeterol 115-21 MCG/ACT inhaler Commonly known as:  ADVAIR HFA Inhale 2 puffs into the lungs 2 (two) times daily.   furosemide 20 MG tablet Commonly known as:  LASIX Take 20 mg by mouth daily.   gabapentin 300 MG capsule Commonly known as:  NEURONTIN Take 400 mg by mouth 3 (three) times daily. 600mg  at night   gabapentin 400 MG capsule Commonly known as:  NEURONTIN Take 800 mg by mouth 3 (three) times daily.   hydrOXYzine 50 MG tablet Commonly known as:  ATARAX/VISTARIL Take 1 tablet (50 mg total) by mouth every 4 (four) hours as needed for itching.   ibuprofen 200 MG tablet Commonly known as:  ADVIL,MOTRIN Take 400 mg by mouth at bedtime as needed for moderate pain.   INCRUSE ELLIPTA 62.5 MCG/INH Aepb Generic drug:  umeclidinium bromide Inhale 1 puff into the lungs daily.   metoprolol tartrate 25 MG tablet Commonly known as:  LOPRESSOR Take 25 mg by mouth daily.   omeprazole 40 MG capsule Commonly known as:  PRILOSEC Take 40 mg by mouth daily.   predniSONE 20 MG tablet Commonly known as:  DELTASONE Take by mouth daily. Taper   triamcinolone cream 0.1 % Commonly known as:  KENALOG Apply 1 application topically daily as needed for itching.   TURMERIC PO Take 2 tablets by mouth daily.   Vitamin D3 125 MCG (5000 UT) Tabs Take 1 tablet by mouth daily.       Allergies  Allergen Reactions  . Statins Other (See Comments)    aching  Consultations:  None   Procedures/Studies: Ct Abdomen Pelvis Wo Contrast  Result Date: 04/21/2018 CLINICAL DATA:  Nausea and vomiting.  Upper abdominal pain. EXAM: CT ABDOMEN AND PELVIS WITHOUT CONTRAST TECHNIQUE: Multidetector CT imaging of the abdomen and pelvis was performed following the standard protocol without IV contrast. COMPARISON:  None. FINDINGS: Lower chest: No significant pulmonary nodules  or acute consolidative airspace disease. Centrilobular emphysema at the lung bases. Hepatobiliary: Diffuse hepatic steatosis. Heterogeneous liver parenchyma and finely irregular liver surface, compatible with hepatic cirrhosis. No discrete liver mass. Cholecystectomy. No biliary ductal dilatation. Pancreas: Normal, with no mass or duct dilation. Spleen: Normal size. No mass. Adrenals/Urinary Tract: Normal adrenals. No hydronephrosis. No renal stones. No contour deforming renal masses. Normal bladder. Stomach/Bowel: Small hiatal hernia. Otherwise normal nondistended stomach. Normal caliber small bowel with no small bowel wall thickening. Normal appendix. Moderate sigmoid diverticulosis, with no large bowel wall thickening or significant pericolonic fat stranding. Vascular/Lymphatic: Atherosclerotic nonaneurysmal abdominal aorta. No pathologically enlarged lymph nodes in the abdomen or pelvis. Reproductive: Status post hysterectomy, with no abnormal findings at the vaginal cuff. No adnexal mass. Other: No pneumoperitoneum, ascites or focal fluid collection. Musculoskeletal: No aggressive appearing focal osseous lesions. Moderate lumbar spondylosis. IMPRESSION: 1. Hepatic cirrhosis and diffuse hepatic steatosis. 2. No evidence of bowel obstruction or acute bowel inflammation. Moderate sigmoid diverticulosis, with no evidence of acute diverticulitis. 3. Small hiatal hernia. 4. Aortic Atherosclerosis (ICD10-I70.0) and Emphysema (ICD10-J43.9). Electronically Signed   By: Delbert Phenix M.D.   On: 04/21/2018 17:20   US Abdomen Limited Ruq  Result Date: 04/21/2018 CLINICAL DATA:  Upper abdominal pain, elevated LFTs and lipase, history CHF, COPD, liver failure, former smoker, history of gallstones and prior cholecystectomy EXAM: ULTRASOUND ABDOMEN LIMITED RIGHT UPPER QUADRANT COMPARISON:  None FINDINGS: Gallbladder: Surgically absent Common bile duct: Diameter: 4 mm diameter, normal Liver: Heterogeneous increased hepatic  echogenicity, question fatty infiltration though this can be seen with cirrhosis and certain infiltrative disorders. Hepatic margins appear smooth without irregularity or nodularity. No definite focal hepatic mass lesion identified. Portal vein is patent on color Doppler imaging with normal direction of blood flow towards the liver. No RIGHT upper quadrant free fluid. IMPRESSION: Post cholecystectomy. Question fatty infiltration of liver as above. Electronically Signed   By: Ulyses Southward M.D.   On: 04/21/2018 16:17      Subjective: No new complaints.  Discharge Exam: Vitals:   04/23/18 0540 04/23/18 0808  BP: 112/61   Pulse: 87   Resp: 16   Temp: 98.1 F (36.7 C)   SpO2: 100% 92%   Vitals:   04/22/18 2008 04/22/18 2151 04/23/18 0540 04/23/18 0808  BP:  (!) 110/48 112/61   Pulse:  86 87   Resp:  20 16   Temp:  98.3 F (36.8 C) 98.1 F (36.7 C)   TempSrc:  Oral Oral   SpO2: 99% 100% 100% 92%  Weight:      Height:        General: Pt is alert, awake, not in acute distress Cardiovascular: RRR, S1/S2 +, no rubs, no gallops Respiratory: CTA bilaterally, no wheezing, no rhonchi Abdominal: Soft, NT, ND, bowel sounds + Extremities: no edema, no cyanosis    The results of significant diagnostics from this hospitalization (including imaging, microbiology, ancillary and laboratory) are listed below for reference.     Microbiology: Recent Results (from the past 240 hour(s))  Urine culture     Status: Abnormal   Collection Time: 04/21/18  1:02 PM  Result Value  Ref Range Status   Specimen Description   Final    URINE, CLEAN CATCH Performed at [facility], [address] [place], [place] [address]    Special Requests   Final    NONE Performed at [facility], [address] [place], [place] [address]    Culture MULTIPLE SPECIES PRESENT, SUGGEST RECOLLECTION (A)  Final   Report Status 04/22/2018 FINAL  Final     Labs: BNP (last 3 results) No results  for input(s): BNP in the last 8760 hours. Basic Metabolic Panel: Recent Labs  Lab 04/21/18 1243 04/22/18 0605  NA 138 140  K 3.5 2.7*  CL 86* 93*  CO2 38* 37*  GLUCOSE 121* 88  BUN 26* 29*  CREATININE 1.97* 1.25*  CALCIUM 10.2 9.7  MG 1.8  --    Liver Function Tests: Recent Labs  Lab 04/21/18 1243 04/22/18 0605  AST 96* 58*  ALT 82* 67*  ALKPHOS 92 75  BILITOT 1.1 1.0  PROT 7.1 6.3*  ALBUMIN 4.0 3.7   Recent Labs  Lab 04/21/18 1243  LIPASE 684*   Recent Labs  Lab 04/21/18 1243  AMMONIA 35   CBC: Recent Labs  Lab 04/21/18 1243 04/22/18 0605  WBC 9.3 8.2  NEUTROABS 6.4  --   HGB 11.8* 10.7*  HCT 35.8* 33.2*  MCV 101.1* 106.4*  PLT 190 153   Cardiac Enzymes: No results for input(s): CKTOTAL, CKMB, CKMBINDEX, TROPONINI in the last 168 hours. BNP: Invalid input(s): POCBNP CBG: No results for input(s): GLUCAP in the last 168 hours. D-Dimer No results for input(s): DDIMER in the last 72 hours. Hgb A1c No results for input(s): HGBA1C in the last 72 hours. Lipid Profile No results for input(s): CHOL, HDL, LDLCALC, TRIG, CHOLHDL, LDLDIRECT in the last 72 hours. Thyroid function studies No results for input(s): TSH, T4TOTAL, T3FREE, THYROIDAB in the last 72 hours.  Invalid input(s): FREET3 Anemia work up No results for input(s): VITAMINB12, FOLATE, FERRITIN, TIBC, IRON, RETICCTPCT in the last 72 hours. Urinalysis    Component Value Date/Time   COLORURINE STRAW (A) 04/21/2018 1302   APPEARANCEUR CLEAR 04/21/2018 1302   LABSPEC 1.010 04/21/2018 1302   PHURINE 7.0 04/21/2018 1302   GLUCOSEU NEGATIVE 04/21/2018 1302   HGBUR TRACE (A) 04/21/2018 1302   BILIRUBINUR NEGATIVE 04/21/2018 1302   KETONESUR NEGATIVE 04/21/2018 1302   PROTEINUR NEGATIVE 04/21/2018 1302   NITRITE NEGATIVE 04/21/2018 1302   LEUKOCYTESUR NEGATIVE 04/21/2018 1302   Sepsis Labs Invalid input(s): PROCALCITONIN,  WBC,  LACTICIDVEN Microbiology Recent Results (from the past 240  hour(s))  Urine culture     Status: Abnormal   Collection Time: 04/21/18  1:02 PM  Result Value Ref Range Status   Specimen Description   Final    URINE, CLEAN CATCH Performed at [facility], [address] [place], [place] [address]    Special Requests   Final    NONE Performed at [facility], [address] [place], [place] [address]    Culture MULTIPLE SPECIES PRESENT, SUGGEST RECOLLECTION (A)  Final   Report Status 04/22/2018 FINAL  Final     Time coordinating discharge: 40 minutes  SIGNED:   Marinda Elk, MD  [facility] 04/23/2018, 8:19 AM Pager   If 7PM-7AM, please contact night-coverage [email or web address] Password [email or web address]

## 2018-04-23 NOTE — Progress Notes (Signed)
Patient given discharge, follow up, and medication instructions, verbalized understanding, IV removed, personal belongings and prescription with patient, family to transport home  

## 2018-07-10 ENCOUNTER — Other Ambulatory Visit: Payer: Self-pay

## 2018-07-10 ENCOUNTER — Encounter (HOSPITAL_BASED_OUTPATIENT_CLINIC_OR_DEPARTMENT_OTHER): Payer: Self-pay | Admitting: Emergency Medicine

## 2018-07-10 ENCOUNTER — Inpatient Hospital Stay (HOSPITAL_BASED_OUTPATIENT_CLINIC_OR_DEPARTMENT_OTHER)
Admission: EM | Admit: 2018-07-10 | Discharge: 2018-08-02 | DRG: 432 | Disposition: E | Payer: Managed Care, Other (non HMO) | Attending: Pulmonary Disease | Admitting: Pulmonary Disease

## 2018-07-10 DIAGNOSIS — N170 Acute kidney failure with tubular necrosis: Secondary | ICD-10-CM | POA: Diagnosis present

## 2018-07-10 DIAGNOSIS — J449 Chronic obstructive pulmonary disease, unspecified: Secondary | ICD-10-CM | POA: Diagnosis not present

## 2018-07-10 DIAGNOSIS — K7040 Alcoholic hepatic failure without coma: Principal | ICD-10-CM | POA: Diagnosis present

## 2018-07-10 DIAGNOSIS — Z789 Other specified health status: Secondary | ICD-10-CM

## 2018-07-10 DIAGNOSIS — G9389 Other specified disorders of brain: Secondary | ICD-10-CM | POA: Diagnosis present

## 2018-07-10 DIAGNOSIS — G9341 Metabolic encephalopathy: Secondary | ICD-10-CM | POA: Diagnosis not present

## 2018-07-10 DIAGNOSIS — Z9289 Personal history of other medical treatment: Secondary | ICD-10-CM

## 2018-07-10 DIAGNOSIS — I130 Hypertensive heart and chronic kidney disease with heart failure and stage 1 through stage 4 chronic kidney disease, or unspecified chronic kidney disease: Secondary | ICD-10-CM | POA: Diagnosis not present

## 2018-07-10 DIAGNOSIS — J9621 Acute and chronic respiratory failure with hypoxia: Secondary | ICD-10-CM | POA: Diagnosis not present

## 2018-07-10 DIAGNOSIS — N179 Acute kidney failure, unspecified: Secondary | ICD-10-CM | POA: Diagnosis not present

## 2018-07-10 DIAGNOSIS — E860 Dehydration: Secondary | ICD-10-CM | POA: Diagnosis not present

## 2018-07-10 DIAGNOSIS — R230 Cyanosis: Secondary | ICD-10-CM

## 2018-07-10 DIAGNOSIS — I5032 Chronic diastolic (congestive) heart failure: Secondary | ICD-10-CM | POA: Diagnosis present

## 2018-07-10 DIAGNOSIS — R6521 Severe sepsis with septic shock: Secondary | ICD-10-CM | POA: Diagnosis not present

## 2018-07-10 DIAGNOSIS — K7682 Hepatic encephalopathy: Secondary | ICD-10-CM

## 2018-07-10 DIAGNOSIS — K209 Esophagitis, unspecified without bleeding: Secondary | ICD-10-CM | POA: Diagnosis present

## 2018-07-10 DIAGNOSIS — N183 Chronic kidney disease, stage 3 unspecified: Secondary | ICD-10-CM | POA: Diagnosis present

## 2018-07-10 DIAGNOSIS — J9611 Chronic respiratory failure with hypoxia: Secondary | ICD-10-CM

## 2018-07-10 DIAGNOSIS — Z9049 Acquired absence of other specified parts of digestive tract: Secondary | ICD-10-CM

## 2018-07-10 DIAGNOSIS — R059 Cough, unspecified: Secondary | ICD-10-CM

## 2018-07-10 DIAGNOSIS — K7030 Alcoholic cirrhosis of liver without ascites: Secondary | ICD-10-CM | POA: Diagnosis not present

## 2018-07-10 DIAGNOSIS — I248 Other forms of acute ischemic heart disease: Secondary | ICD-10-CM | POA: Diagnosis not present

## 2018-07-10 DIAGNOSIS — J9600 Acute respiratory failure, unspecified whether with hypoxia or hypercapnia: Secondary | ICD-10-CM

## 2018-07-10 DIAGNOSIS — K767 Hepatorenal syndrome: Secondary | ICD-10-CM | POA: Diagnosis not present

## 2018-07-10 DIAGNOSIS — R339 Retention of urine, unspecified: Secondary | ICD-10-CM | POA: Diagnosis not present

## 2018-07-10 DIAGNOSIS — E870 Hyperosmolality and hypernatremia: Secondary | ICD-10-CM | POA: Diagnosis not present

## 2018-07-10 DIAGNOSIS — R627 Adult failure to thrive: Secondary | ICD-10-CM | POA: Diagnosis present

## 2018-07-10 DIAGNOSIS — I4891 Unspecified atrial fibrillation: Secondary | ICD-10-CM | POA: Diagnosis not present

## 2018-07-10 DIAGNOSIS — D649 Anemia, unspecified: Secondary | ICD-10-CM | POA: Diagnosis present

## 2018-07-10 DIAGNOSIS — I471 Supraventricular tachycardia: Secondary | ICD-10-CM | POA: Diagnosis not present

## 2018-07-10 DIAGNOSIS — Z791 Long term (current) use of non-steroidal anti-inflammatories (NSAID): Secondary | ICD-10-CM

## 2018-07-10 DIAGNOSIS — Z978 Presence of other specified devices: Secondary | ICD-10-CM

## 2018-07-10 DIAGNOSIS — Z515 Encounter for palliative care: Secondary | ICD-10-CM | POA: Diagnosis not present

## 2018-07-10 DIAGNOSIS — Z87891 Personal history of nicotine dependence: Secondary | ICD-10-CM

## 2018-07-10 DIAGNOSIS — Z90710 Acquired absence of both cervix and uterus: Secondary | ICD-10-CM

## 2018-07-10 DIAGNOSIS — J15211 Pneumonia due to Methicillin susceptible Staphylococcus aureus: Secondary | ICD-10-CM | POA: Diagnosis not present

## 2018-07-10 DIAGNOSIS — D631 Anemia in chronic kidney disease: Secondary | ICD-10-CM | POA: Diagnosis not present

## 2018-07-10 DIAGNOSIS — D6489 Other specified anemias: Secondary | ICD-10-CM | POA: Diagnosis present

## 2018-07-10 DIAGNOSIS — Z888 Allergy status to other drugs, medicaments and biological substances status: Secondary | ICD-10-CM

## 2018-07-10 DIAGNOSIS — J440 Chronic obstructive pulmonary disease with acute lower respiratory infection: Secondary | ICD-10-CM | POA: Diagnosis not present

## 2018-07-10 DIAGNOSIS — A4151 Sepsis due to Escherichia coli [E. coli]: Secondary | ICD-10-CM | POA: Diagnosis not present

## 2018-07-10 DIAGNOSIS — F1010 Alcohol abuse, uncomplicated: Secondary | ICD-10-CM | POA: Diagnosis present

## 2018-07-10 DIAGNOSIS — K7290 Hepatic failure, unspecified without coma: Secondary | ICD-10-CM

## 2018-07-10 DIAGNOSIS — E876 Hypokalemia: Secondary | ICD-10-CM | POA: Diagnosis present

## 2018-07-10 DIAGNOSIS — K219 Gastro-esophageal reflux disease without esophagitis: Secondary | ICD-10-CM | POA: Diagnosis present

## 2018-07-10 DIAGNOSIS — R1310 Dysphagia, unspecified: Secondary | ICD-10-CM | POA: Diagnosis present

## 2018-07-10 DIAGNOSIS — R0902 Hypoxemia: Secondary | ICD-10-CM

## 2018-07-10 DIAGNOSIS — Z9911 Dependence on respirator [ventilator] status: Secondary | ICD-10-CM

## 2018-07-10 DIAGNOSIS — E874 Mixed disorder of acid-base balance: Secondary | ICD-10-CM | POA: Diagnosis not present

## 2018-07-10 DIAGNOSIS — R7989 Other specified abnormal findings of blood chemistry: Secondary | ICD-10-CM

## 2018-07-10 DIAGNOSIS — Z79899 Other long term (current) drug therapy: Secondary | ICD-10-CM

## 2018-07-10 DIAGNOSIS — Y95 Nosocomial condition: Secondary | ICD-10-CM | POA: Diagnosis not present

## 2018-07-10 DIAGNOSIS — I272 Pulmonary hypertension, unspecified: Secondary | ICD-10-CM | POA: Diagnosis present

## 2018-07-10 DIAGNOSIS — Z6824 Body mass index (BMI) 24.0-24.9, adult: Secondary | ICD-10-CM | POA: Diagnosis not present

## 2018-07-10 DIAGNOSIS — Z8249 Family history of ischemic heart disease and other diseases of the circulatory system: Secondary | ICD-10-CM

## 2018-07-10 DIAGNOSIS — Z7289 Other problems related to lifestyle: Secondary | ICD-10-CM | POA: Diagnosis not present

## 2018-07-10 DIAGNOSIS — Z452 Encounter for adjustment and management of vascular access device: Secondary | ICD-10-CM

## 2018-07-10 DIAGNOSIS — Z9981 Dependence on supplemental oxygen: Secondary | ICD-10-CM

## 2018-07-10 DIAGNOSIS — J9622 Acute and chronic respiratory failure with hypercapnia: Secondary | ICD-10-CM | POA: Diagnosis not present

## 2018-07-10 DIAGNOSIS — B3781 Candidal esophagitis: Secondary | ICD-10-CM | POA: Diagnosis present

## 2018-07-10 DIAGNOSIS — R778 Other specified abnormalities of plasma proteins: Secondary | ICD-10-CM | POA: Diagnosis present

## 2018-07-10 DIAGNOSIS — J9690 Respiratory failure, unspecified, unspecified whether with hypoxia or hypercapnia: Secondary | ICD-10-CM

## 2018-07-10 DIAGNOSIS — Z4659 Encounter for fitting and adjustment of other gastrointestinal appliance and device: Secondary | ICD-10-CM

## 2018-07-10 DIAGNOSIS — Z7952 Long term (current) use of systemic steroids: Secondary | ICD-10-CM

## 2018-07-10 DIAGNOSIS — R338 Other retention of urine: Secondary | ICD-10-CM

## 2018-07-10 DIAGNOSIS — Z66 Do not resuscitate: Secondary | ICD-10-CM | POA: Diagnosis not present

## 2018-07-10 DIAGNOSIS — R05 Cough: Secondary | ICD-10-CM

## 2018-07-10 DIAGNOSIS — R54 Age-related physical debility: Secondary | ICD-10-CM | POA: Diagnosis present

## 2018-07-10 DIAGNOSIS — K7460 Unspecified cirrhosis of liver: Secondary | ICD-10-CM | POA: Diagnosis present

## 2018-07-10 LAB — URINALYSIS, ROUTINE W REFLEX MICROSCOPIC
BILIRUBIN URINE: NEGATIVE
Glucose, UA: NEGATIVE mg/dL
Hgb urine dipstick: NEGATIVE
Ketones, ur: NEGATIVE mg/dL
Nitrite: NEGATIVE
Protein, ur: NEGATIVE mg/dL
Specific Gravity, Urine: 1.01 (ref 1.005–1.030)
pH: 6 (ref 5.0–8.0)

## 2018-07-10 LAB — COMPREHENSIVE METABOLIC PANEL
ALT: 43 U/L (ref 0–44)
AST: 110 U/L — ABNORMAL HIGH (ref 15–41)
Albumin: 2.9 g/dL — ABNORMAL LOW (ref 3.5–5.0)
Alkaline Phosphatase: 145 U/L — ABNORMAL HIGH (ref 38–126)
Anion gap: 14 (ref 5–15)
BUN: 46 mg/dL — ABNORMAL HIGH (ref 8–23)
CALCIUM: 8.8 mg/dL — AB (ref 8.9–10.3)
CO2: 18 mmol/L — ABNORMAL LOW (ref 22–32)
Chloride: 101 mmol/L (ref 98–111)
Creatinine, Ser: 2.5 mg/dL — ABNORMAL HIGH (ref 0.44–1.00)
GFR calc Af Amer: 22 mL/min — ABNORMAL LOW (ref >60)
GFR, EST NON AFRICAN AMERICAN: 19 mL/min — AB (ref >60)
Glucose, Bld: 105 mg/dL — ABNORMAL HIGH (ref 70–99)
Potassium: 2.9 mmol/L — ABNORMAL LOW (ref 3.5–5.1)
Sodium: 133 mmol/L — ABNORMAL LOW (ref 135–145)
Total Bilirubin: 2.2 mg/dL — ABNORMAL HIGH (ref 0.3–1.2)
Total Protein: 6.5 g/dL (ref 6.5–8.1)

## 2018-07-10 LAB — CBC WITH DIFFERENTIAL/PLATELET
Abs Immature Granulocytes: 0.02 10*3/uL (ref 0.00–0.07)
Basophils Absolute: 0 10*3/uL (ref 0.0–0.1)
Basophils Relative: 1 %
Eosinophils Absolute: 0.1 10*3/uL (ref 0.0–0.5)
Eosinophils Relative: 2 %
HCT: 29.3 % — ABNORMAL LOW (ref 36.0–46.0)
Hemoglobin: 9.6 g/dL — ABNORMAL LOW (ref 12.0–15.0)
Immature Granulocytes: 0 %
Lymphocytes Relative: 12 %
Lymphs Abs: 0.7 10*3/uL (ref 0.7–4.0)
MCH: 30.1 pg (ref 26.0–34.0)
MCHC: 32.8 g/dL (ref 30.0–36.0)
MCV: 91.8 fL (ref 80.0–100.0)
Monocytes Absolute: 0.4 10*3/uL (ref 0.1–1.0)
Monocytes Relative: 7 %
Neutro Abs: 4.4 10*3/uL (ref 1.7–7.7)
Neutrophils Relative %: 78 %
Platelets: 178 10*3/uL (ref 150–400)
RBC: 3.19 MIL/uL — AB (ref 3.87–5.11)
RDW: 17.6 % — ABNORMAL HIGH (ref 11.5–15.5)
WBC: 5.5 10*3/uL (ref 4.0–10.5)
nRBC: 0 % (ref 0.0–0.2)

## 2018-07-10 LAB — AMMONIA: Ammonia: 85 umol/L — ABNORMAL HIGH (ref 9–35)

## 2018-07-10 LAB — URINALYSIS, MICROSCOPIC (REFLEX): RBC / HPF: NONE SEEN RBC/hpf (ref 0–5)

## 2018-07-10 LAB — MAGNESIUM: Magnesium: 2.2 mg/dL (ref 1.7–2.4)

## 2018-07-10 LAB — LACTIC ACID, PLASMA: Lactic Acid, Venous: 1.7 mmol/L (ref 0.5–1.9)

## 2018-07-10 LAB — TROPONIN I: TROPONIN I: 0.04 ng/mL — AB (ref <0.03)

## 2018-07-10 MED ORDER — LACTULOSE 10 GM/15ML PO SOLN
20.0000 g | Freq: Three times a day (TID) | ORAL | Status: DC
  Administered 2018-07-11 (×3): 20 g via ORAL
  Filled 2018-07-10 (×3): qty 30

## 2018-07-10 MED ORDER — POTASSIUM CHLORIDE CRYS ER 20 MEQ PO TBCR
40.0000 meq | EXTENDED_RELEASE_TABLET | Freq: Once | ORAL | Status: AC
  Administered 2018-07-10: 40 meq via ORAL
  Filled 2018-07-10: qty 2

## 2018-07-10 MED ORDER — ONDANSETRON HCL 4 MG PO TABS: 4.0000 mg | ORAL_TABLET | Freq: Four times a day (QID) | ORAL | Status: DC | PRN

## 2018-07-10 MED ORDER — SODIUM CHLORIDE 0.9 % IV SOLN
Freq: Once | INTRAVENOUS | Status: AC
  Administered 2018-07-10: 20:00:00 via INTRAVENOUS

## 2018-07-10 MED ORDER — LORAZEPAM 2 MG/ML IJ SOLN: 0.0000 mg | Freq: Two times a day (BID) | INTRAMUSCULAR | Status: DC

## 2018-07-10 MED ORDER — SODIUM CHLORIDE 0.9 % IV SOLN
INTRAVENOUS | Status: DC | PRN
  Administered 2018-07-10: 500 mL via INTRAVENOUS

## 2018-07-10 MED ORDER — ADULT MULTIVITAMIN W/MINERALS CH
1.0000 | ORAL_TABLET | Freq: Every day | ORAL | Status: DC
  Administered 2018-07-11 – 2018-07-13 (×2): 1 via ORAL
  Filled 2018-07-10 (×2): qty 1

## 2018-07-10 MED ORDER — THIAMINE HCL 100 MG/ML IJ SOLN
100.0000 mg | Freq: Every day | INTRAMUSCULAR | Status: DC
  Administered 2018-07-12: 100 mg via INTRAVENOUS
  Filled 2018-07-10: qty 2

## 2018-07-10 MED ORDER — HYDROXYZINE HCL 25 MG PO TABS
50.0000 mg | ORAL_TABLET | ORAL | Status: DC | PRN
  Administered 2018-07-11: 50 mg via ORAL
  Filled 2018-07-10 (×2): qty 2

## 2018-07-10 MED ORDER — MOMETASONE FURO-FORMOTEROL FUM 200-5 MCG/ACT IN AERO
2.0000 | INHALATION_SPRAY | Freq: Two times a day (BID) | RESPIRATORY_TRACT | Status: DC
  Administered 2018-07-11 – 2018-07-14 (×7): 2 via RESPIRATORY_TRACT
  Filled 2018-07-10 (×2): qty 8.8

## 2018-07-10 MED ORDER — ALBUTEROL SULFATE (2.5 MG/3ML) 0.083% IN NEBU: 2.5000 mg | INHALATION_SOLUTION | RESPIRATORY_TRACT | Status: DC | PRN

## 2018-07-10 MED ORDER — VITAMIN D 25 MCG (1000 UNIT) PO TABS
5000.0000 [IU] | ORAL_TABLET | Freq: Every day | ORAL | Status: DC
  Administered 2018-07-11 – 2018-07-13 (×2): 5000 [IU] via ORAL
  Filled 2018-07-10 (×2): qty 5

## 2018-07-10 MED ORDER — LORAZEPAM 2 MG/ML IJ SOLN
0.0000 mg | Freq: Four times a day (QID) | INTRAMUSCULAR | Status: DC
  Filled 2018-07-10: qty 1

## 2018-07-10 MED ORDER — METOPROLOL TARTRATE 25 MG PO TABS
25.0000 mg | ORAL_TABLET | Freq: Every day | ORAL | Status: DC
  Administered 2018-07-11 – 2018-07-13 (×2): 25 mg via ORAL
  Filled 2018-07-10 (×2): qty 1

## 2018-07-10 MED ORDER — SODIUM CHLORIDE 0.9 % IV BOLUS
1000.0000 mL | Freq: Once | INTRAVENOUS | Status: AC
  Administered 2018-07-10: 1000 mL via INTRAVENOUS

## 2018-07-10 MED ORDER — PANTOPRAZOLE SODIUM 40 MG PO TBEC
40.0000 mg | DELAYED_RELEASE_TABLET | Freq: Every day | ORAL | Status: DC
  Administered 2018-07-11 – 2018-07-13 (×2): 40 mg via ORAL
  Filled 2018-07-10 (×2): qty 1

## 2018-07-10 MED ORDER — ONDANSETRON HCL 4 MG/2ML IJ SOLN
4.0000 mg | Freq: Four times a day (QID) | INTRAMUSCULAR | Status: DC | PRN
  Administered 2018-07-13: 4 mg via INTRAVENOUS
  Filled 2018-07-10: qty 2

## 2018-07-10 MED ORDER — FOLIC ACID 1 MG PO TABS
1.0000 mg | ORAL_TABLET | Freq: Every day | ORAL | Status: DC
  Administered 2018-07-11 – 2018-07-13 (×2): 1 mg via ORAL
  Filled 2018-07-10 (×2): qty 1

## 2018-07-10 MED ORDER — SODIUM CHLORIDE 0.9 % IV SOLN
INTRAVENOUS | Status: DC
  Administered 2018-07-11 – 2018-07-12 (×3): via INTRAVENOUS

## 2018-07-10 MED ORDER — UMECLIDINIUM BROMIDE 62.5 MCG/INH IN AEPB
1.0000 | INHALATION_SPRAY | Freq: Every day | RESPIRATORY_TRACT | Status: DC
  Administered 2018-07-11 – 2018-07-14 (×4): 1 via RESPIRATORY_TRACT
  Filled 2018-07-10 (×2): qty 7

## 2018-07-10 MED ORDER — OXYCODONE HCL 5 MG PO TABS
5.0000 mg | ORAL_TABLET | Freq: Once | ORAL | Status: AC
  Administered 2018-07-10: 5 mg via ORAL
  Filled 2018-07-10: qty 1

## 2018-07-10 MED ORDER — VITAMIN B-1 100 MG PO TABS
100.0000 mg | ORAL_TABLET | Freq: Every day | ORAL | Status: DC
  Administered 2018-07-11 – 2018-07-13 (×2): 100 mg via ORAL
  Filled 2018-07-10 (×2): qty 1

## 2018-07-10 MED ORDER — LORAZEPAM 1 MG PO TABS: 1.0000 mg | ORAL_TABLET | Freq: Four times a day (QID) | ORAL | Status: DC | PRN

## 2018-07-10 MED ORDER — HYDRALAZINE HCL 20 MG/ML IJ SOLN: 5.0000 mg | INTRAMUSCULAR | Status: DC | PRN

## 2018-07-10 MED ORDER — LORAZEPAM 2 MG/ML IJ SOLN: 1.0000 mg | Freq: Four times a day (QID) | INTRAMUSCULAR | Status: DC | PRN

## 2018-07-10 MED ORDER — POTASSIUM CHLORIDE 10 MEQ/100ML IV SOLN
10.0000 meq | INTRAVENOUS | Status: AC
  Administered 2018-07-10 (×2): 10 meq via INTRAVENOUS
  Filled 2018-07-10 (×2): qty 100

## 2018-07-10 MED ORDER — TURMERIC 500 MG PO TABS: ORAL_TABLET | Freq: Every day | ORAL | Status: DC

## 2018-07-10 MED ORDER — ACETAMINOPHEN 500 MG PO TABS
1000.0000 mg | ORAL_TABLET | Freq: Once | ORAL | Status: AC
  Administered 2018-07-10: 1000 mg via ORAL
  Filled 2018-07-10: qty 2

## 2018-07-10 MED ORDER — GABAPENTIN 400 MG PO CAPS
400.0000 mg | ORAL_CAPSULE | Freq: Three times a day (TID) | ORAL | Status: DC
  Administered 2018-07-11 (×4): 400 mg via ORAL
  Filled 2018-07-10 (×4): qty 1

## 2018-07-10 NOTE — ED Triage Notes (Signed)
Reports decreased appetite.  Per PCP yesterday patient in acute renal failure with elevated bilirubin.  Patient lethargic and slow to answer questions during triage.

## 2018-07-10 NOTE — ED Notes (Signed)
Gave report to Werner Lean, Charity fundraiser from [place].

## 2018-07-10 NOTE — ED Provider Notes (Signed)
[facility] EMERGENCY DEPARTMENT Provider Note   CSN: [id number] Arrival date & time: 07/12/2018  1709     History   Chief Complaint Chief Complaint  Patient presents with  . Failure To Thrive    HPI Alyssa Mckenzie is a 68 y.o. female.  68 yo F with a chief complaint of weakness.  Going on for the past week.  Patient had recently been diagnosed with candidal esophagitis.  Due to this she had been having decreased oral intake.  Saw her family doctor yesterday and was noted to be very tired not eating and drinking recommended that she come to the hospital to be admitted which she declined.  She had lab work drawn at that time that showed that she has an acute kidney injury with hypokalemia.  She came to the ED for admission.  She denies chest pain denies shortness of breath denies cough or congestion.  Denies vomiting or diarrhea.  Old records were reviewed and the patient had an endoscopy with esophageal dilation that was done on January 21.  She states that since then she has been able to swallow a bit better but still has trouble with large things.  Felt that her oral intake had improved but maybe it was not enough to keep her hydrated.  She denied any chest pain or shortness of breath post procedure.  The history is provided by the patient.  Illness  Severity:  Mild Onset quality:  Gradual Duration:  1 week Timing:  Constant Progression:  Worsening Chronicity:  New Associated symptoms: fatigue and sore throat   Associated symptoms: no chest pain, no congestion, no fever, no headaches, no myalgias, no nausea, no rhinorrhea, no shortness of breath, no vomiting and no wheezing     Past Medical History:  Diagnosis Date  . CHF (congestive heart failure) (HCC)   . COPD (chronic obstructive pulmonary disease) (HCC)   . Liver failure [facility])     Patient Active Problem List   Diagnosis Date Noted  . Dehydration 07/25/2018  . Hypokalemia 07/15/2018  . Elevated troponin  07/08/2018  . ARF (acute renal failure) (HCC) 04/21/2018  . COPD exacerbation (HCC) 04/21/2018  . Acute diastolic HF (heart failure) (HCC) 04/21/2018  . Liver cirrhosis (HCC) 04/21/2018  . Itching 04/21/2018    Past Surgical History:  Procedure Laterality Date  . ABDOMINAL HYSTERECTOMY    . BLADDER SURGERY    . BREAST SURGERY    . CHOLECYSTECTOMY    . TONSILLECTOMY       OB History   No obstetric history on file.      Home Medications    Prior to Admission medications   Medication Sig Start Date End Date Taking? Authorizing Provider  albuterol (PROVENTIL HFA;VENTOLIN HFA) 108 (90 Base) MCG/ACT inhaler Inhale into the lungs every 6 (six) hours as needed for wheezing or shortness of breath.    [provider]  Cholecalciferol (VITAMIN D3) 125 MCG (5000 UT) TABS Take 1 tablet by mouth daily.    [provider]  fluticasone-salmeterol (ADVAIR HFA) 115-21 MCG/ACT inhaler Inhale 2 puffs into the lungs 2 (two) times daily.    [provider]  furosemide (LASIX) 20 MG tablet Take 20 mg by mouth daily.    [provider]  gabapentin (NEURONTIN) 300 MG capsule Take 400 mg by mouth 3 (three) times daily. 600mg  at night    [provider]  gabapentin (NEURONTIN) 400 MG capsule Take 800 mg by mouth 3 (three) times daily.  04/11/18   [provider]  hydrOXYzine (ATARAX/VISTARIL) 50 MG tablet Take 1 tablet (50 mg total) by mouth every 4 (four) hours as needed for itching. 04/23/18   Marinda Elk, MD  ibuprofen (ADVIL,MOTRIN) 200 MG tablet Take 400 mg by mouth at bedtime as needed for moderate pain.    [provider]  metoprolol tartrate (LOPRESSOR) 25 MG tablet Take 25 mg by mouth daily.    [provider]  omeprazole (PRILOSEC) 40 MG capsule Take 40 mg by mouth daily. 04/17/17   [provider]  predniSONE (DELTASONE) 20 MG tablet Take by mouth daily. Taper 04/16/18   [provider]    triamcinolone cream (KENALOG) 0.1 % Apply 1 application topically daily as needed for itching. 04/16/18   [provider]  TURMERIC PO Take 2 tablets by mouth daily.    [provider]  umeclidinium bromide (INCRUSE ELLIPTA) 62.5 MCG/INH AEPB Inhale 1 puff into the lungs daily.    [provider]    Family History History reviewed. No pertinent family history.  Social History Social History   Tobacco Use  . Smoking status: Former Games developer  . Smokeless tobacco: Never Used  Substance Use Topics  . Alcohol use: Yes    Alcohol/week: 4.0 standard drinks    Types: 4 Glasses of wine per week    Comment: 4 glasses/day  . Drug use: Never     Allergies   Statins   Review of Systems Review of Systems  Constitutional: Positive for fatigue. Negative for chills and fever.  HENT: Positive for sore throat and trouble swallowing. Negative for congestion and rhinorrhea.   Eyes: Negative for redness and visual disturbance.  Respiratory: Negative for shortness of breath and wheezing.   Cardiovascular: Negative for chest pain and palpitations.  Gastrointestinal: Negative for nausea and vomiting.  Genitourinary: Negative for dysuria and urgency.  Musculoskeletal: Negative for arthralgias and myalgias.  Skin: Negative for pallor and wound.  Neurological: Negative for dizziness and headaches.     Physical Exam Updated Vital Signs BP 132/64   Pulse 94   Temp (!) 97.4 F (36.3 C) (Oral)   Resp 16   Ht 5\' 2"  (1.575 m)   SpO2 100%   BMI 24.47 kg/m   Physical Exam Vitals signs and nursing note reviewed.  Constitutional:      General: She is not in acute distress.    Appearance: She is well-developed. She is not diaphoretic.     Comments: Chronically ill appearing   HENT:     Head: Normocephalic and atraumatic.  Eyes:     Pupils: Pupils are equal, round, and reactive to light.  Neck:     Musculoskeletal: Normal range of motion and neck supple.   Cardiovascular:     Rate and Rhythm: Normal rate and regular rhythm.     Heart sounds: No murmur. No friction rub. No gallop.   Pulmonary:     Effort: Pulmonary effort is normal.     Breath sounds: No wheezing or rales.  Abdominal:     General: There is no distension.     Palpations: Abdomen is soft.     Tenderness: There is no abdominal tenderness.  Musculoskeletal:        General: No tenderness.  Skin:    General: Skin is warm and dry.  Neurological:     Mental Status: She is alert and oriented to person, place, and time.  Psychiatric:        Behavior:  Behavior normal.      ED Treatments / Results  Labs (all labs ordered are listed, but only abnormal results are displayed) Labs Reviewed  CBC WITH DIFFERENTIAL/PLATELET - Abnormal; Notable for the following components:      Result Value   RBC 3.19 (*)    Hemoglobin 9.6 (*)    HCT 29.3 (*)    RDW 17.6 (*)    All other components within normal limits  COMPREHENSIVE METABOLIC PANEL - Abnormal; Notable for the following components:   Sodium 133 (*)    Potassium 2.9 (*)    CO2 18 (*)    Glucose, Bld 105 (*)    BUN 46 (*)    Creatinine, Ser 2.50 (*)    Calcium 8.8 (*)    Albumin 2.9 (*)    AST 110 (*)    Alkaline Phosphatase 145 (*)    Total Bilirubin 2.2 (*)    GFR calc non Af Amer 19 (*)    GFR calc Af Amer 22 (*)    All other components within normal limits  TROPONIN I - Abnormal; Notable for the following components:   Troponin I 0.04 (*)    All other components within normal limits  AMMONIA - Abnormal; Notable for the following components:   Ammonia 85 (*)    All other components within normal limits  URINALYSIS, ROUTINE W REFLEX MICROSCOPIC - Abnormal; Notable for the following components:   APPearance CLOUDY (*)    Leukocytes, UA SMALL (*)    All other components within normal limits  URINALYSIS, MICROSCOPIC (REFLEX) - Abnormal; Notable for the following components:   Bacteria, UA RARE (*)    All other  components within normal limits  LACTIC ACID, PLASMA  MAGNESIUM  LACTIC ACID, PLASMA  CREATININE, URINE, RANDOM  SODIUM, URINE, RANDOM    EKG EKG Interpretation  Date/Time:  Friday July 10 2018 17:25:27 EST Ventricular Rate:  96 PR Interval:    QRS Duration: 100 QT Interval:  387 QTC Calculation: 490 R Axis:   46 Text Interpretation:  Sinus rhythm Borderline prolonged PR interval Low voltage, extremity and precordial leads Nonspecific T abnormalities, lateral leads Borderline prolonged QT interval No old tracing to compare Confirmed by Melene PlanFloyd, Donaven Criswell [phone]([id number]) on 07/20/2018 5:39:39 PM   Radiology No results found.  Procedures Procedures (including critical care time)  Medications Ordered in ED Medications  potassium chloride 10 mEq in 100 mL IVPB (10 mEq Intravenous New Bag/Given 07/31/2018 1827)  sodium chloride 0.9 % bolus 1,000 mL (1,000 mLs Intravenous New Bag/Given 07/08/2018 1755)  potassium chloride SA (K-DUR,KLOR-CON) CR tablet 40 mEq (40 mEq Oral Given 07/29/2018 1825)     Initial Impression / Assessment and Plan / ED Course  I have reviewed the triage vital signs and the nursing notes.  Pertinent labs & imaging results that were available during my care of the patient were reviewed by me and considered in my medical decision making (see chart for details).     68 yo F with a chief complaint of decreased oral intake and fatigue.  Going on for the past week.  Thought to be due to candidal esophagitis and started on Diflucan without improvement.  On my exam the patient clinically appears to be dehydrated.  We will give a bolus of fluids.  Outpatient labs were reviewed and the patient's creatinine is gone from a baseline of about 1.2-2.5.  With her increased lethargy will obtain an ammonia level.  She is not complaining of chest pain  or shortness of breath but with her severe nausea and fatigue will obtain a troponin.  Patient's troponin is just barely measurable.  Without chest  pain or shortness of breath I suspect this is most likely demand ischemia.  Her kidney function is consistent with her outpatient lab result.  Will discuss with hospitalist for admission.   CRITICAL CARE Performed by: Rae Roamaniel Patrick Ednamae Schiano   Total critical care time: 35 minutes  Critical care time was exclusive of separately billable procedures and treating other patients.  Critical care was necessary to treat or prevent imminent or life-threatening deterioration.  Critical care was time spent personally by me on the following activities: development of treatment plan with patient and/or surrogate as well as nursing, discussions with consultants, evaluation of patient's response to treatment, examination of patient, obtaining history from patient or surrogate, ordering and performing treatments and interventions, ordering and review of laboratory studies, ordering and review of radiographic studies, pulse oximetry and re-evaluation of patient's condition.  The patients results and plan were reviewed and discussed.   Any x-rays performed were independently reviewed by myself.   Differential diagnosis were considered with the presenting HPI.  Medications  potassium chloride 10 mEq in 100 mL IVPB (10 mEq Intravenous New Bag/Given 07/16/2018 1827)  sodium chloride 0.9 % bolus 1,000 mL (1,000 mLs Intravenous New Bag/Given 07/23/2018 1755)  potassium chloride SA (K-DUR,KLOR-CON) CR tablet 40 mEq (40 mEq Oral Given 07/26/2018 1825)    Vitals:   07/20/2018 1716 07/25/2018 1717 07/09/2018 1730  BP: 140/66  132/64  Pulse: (!) 101  94  Resp: 16  16  Temp: (!) 97.4 F (36.3 C)    TempSrc: Oral    SpO2: (!) 78%  100%  Height:  5\' 2"  (1.575 m)     Final diagnoses:  AKI (acute kidney injury) (HCC)  Hypokalemia    Admission/ observation were discussed with the admitting physician, patient and/or family and they are comfortable with the plan.    Final Clinical Impressions(s) / ED Diagnoses   Final  diagnoses:  AKI (acute kidney injury) [facility](HCC)  Hypokalemia    ED Discharge Orders    None       Melene PlanFloyd, Brit Wernette, DO 07/07/2018 1920

## 2018-07-10 NOTE — Plan of Care (Signed)
68 yo F with a chief complaint of 1 wk of generalized weakness  diagnosed with candidal esophagitis not eating and drinking  Found to have  acute kidney injury with hypokalemia  denies chest pain denies shortness of breath denies cough or congestion.  Denies vomiting or diarrhea currently  but had diarrhea in the past, on Lasix  Hx of dysphagia sp dilatation on 21st of January a bit better after proceedure  To have slightly elevated troponin no chest pain or shortness of breath likely demand ischemia   Discussed with Dr. Adela Lank at [facility] At 7:12 PM  CR now 2.5 up from BL 1.2  K 2.9 ordered 2 runs kCl and 40 MEQ  Ammonia 85 no asterixis no confusion  Hx of Cirrhosis  Accepted as observation for dehydration and AKI to telemetry floor  Noma Quijas 7:18 PM

## 2018-07-10 NOTE — Progress Notes (Signed)
PHARMACIST - PHYSICIAN ORDER COMMUNICATION  CONCERNING: P&T Medication Policy on Herbal Medications  DESCRIPTION:  This patient's order for:  Turmeric  has been noted.  This product(s) is classified as an "herbal" or natural product. Due to a lack of definitive safety studies or FDA approval, nonstandard manufacturing practices, plus the potential risk of unknown drug-drug interactions while on inpatient medications, the Pharmacy and Therapeutics Committee does not permit the use of "herbal" or natural products of this type within [facility].   ACTION TAKEN: The pharmacy department is unable to verify this order at this time and your patient has been informed of this safety policy. Please reevaluate patient's clinical condition at discharge and address if the herbal or natural product(s) should be resumed at that time.  Christoper Fabian, PharmD, BCPS Clinical pharmacist  **Pharmacist phone directory can now be found on [email or web address] ([email or web address]).  Listed under [facility]. 2018/08/07 11:52 PM

## 2018-07-10 NOTE — ED Notes (Signed)
ED Provider at bedside. 

## 2018-07-10 NOTE — H&P (Signed)
History and Physical    Alyssa Mckenzie BJY:[id number] DOB: 1950-12-15 DOA: 07/13/2018  Referring MD/NP/PA:   PCP: Patient, No Pcp Per   Patient coming from:  The patient is coming from home.  At baseline, pt is independent for most of ADL.        Chief Complaint: Generalized weakness and fatigue.  HPI: Alyssa Mckenzie is a 68 y.o. female with medical history significant of dCHF, COPD, GERD, liver cirrhosis, CKD 3, alcohol use, anemia, who presents with generalized weakness and fatigue.  Patient states that because of difficulty swallowing, she had esophageal dilation procedure on 1/21, and was found to have Candida esophagitis.  She completed 10-day course of Diflucan, symptoms improved significantly except for mild pain on swallowing.  Because for that, she has been having decreased oral intake. She developed generalized weakness and fatigue.  No unilateral weakness, numbness or tingling his extremities.  No facial droop or slurred speech.  Patient denies chest pain cough, fever or chills.  Patient does not have nausea, vomiting, diarrhea, abdominal pain, symptoms of UTI.  Patient states that she drinks 2 to 3 glasses of wine every day, did not drink in the past 3 days.  Patient is drowsy on interview, but is oriented x3.  ED Course: pt was found to have WBC 5.5, troponin 0.04, lactic acid of 1.7, urinalysis not impressive, ammonia level 85, potassium 2.8, bicarbonate 18, creatinine 2.50, BUN 46, magnesium 2.2, abnormal liver function (ALP 145, AST 110, ALT 43, total bilirubin 2.2), patient is placed on telemetry bed for observation.  Review of Systems:   General: no fevers, chills, no body weight gain, has poor appetite, has fatigue HEENT: no blurry vision, hearing changes or sore throat Respiratory: no dyspnea, coughing, wheezing CV: no chest pain, no palpitations GI: no nausea, vomiting, abdominal pain, diarrhea, constipation. Has mild pain on swallowing. GU: no dysuria, burning on  urination, increased urinary frequency, hematuria  Ext: has trace leg edema Neuro: no unilateral weakness, numbness, or tingling, no vision change or hearing loss. Pt is drowsy. Skin: no rash, no skin tear. Has bruises in both arms. MSK: No muscle spasm, no deformity, no limitation of range of movement in spin Heme: No easy bruising.  Travel history: No recent long distant travel.  Allergy:  Allergies  Allergen Reactions  . Statins Other (See Comments)    aching    Past Medical History:  Diagnosis Date  . CHF (congestive heart failure) (HCC)   . COPD (chronic obstructive pulmonary disease) (HCC)   . Liver failure [facility](HCC)     Past Surgical History:  Procedure Laterality Date  . ABDOMINAL HYSTERECTOMY    . BLADDER SURGERY    . BREAST SURGERY    . CHOLECYSTECTOMY    . TONSILLECTOMY      Social History:  reports that she has quit smoking. She has never used smokeless tobacco. She reports current alcohol use of about 4.0 standard drinks of alcohol per week. She reports that she does not use drugs.  Family History:  Family History  Problem Relation Age of Onset  . Congestive Heart Failure Father      Prior to Admission medications   Medication Sig Start Date End Date Taking? Authorizing Provider  albuterol (PROVENTIL HFA;VENTOLIN HFA) 108 (90 Base) MCG/ACT inhaler Inhale into the lungs every 6 (six) hours as needed for wheezing or shortness of breath.    [provider]  Cholecalciferol (VITAMIN D3) 125 MCG (5000 UT) TABS Take 1 tablet by mouth daily.  [provider]  fluticasone-salmeterol (ADVAIR HFA) 115-21 MCG/ACT inhaler Inhale 2 puffs into the lungs 2 (two) times daily.    [provider]  furosemide (LASIX) 20 MG tablet Take 20 mg by mouth daily.    [provider]  gabapentin (NEURONTIN) 300 MG capsule Take 400 mg by mouth 3 (three) times daily. 600mg  at night    [provider]  gabapentin (NEURONTIN) 400 MG capsule Take  800 mg by mouth 3 (three) times daily. 04/11/18   [provider]  hydrOXYzine (ATARAX/VISTARIL) 50 MG tablet Take 1 tablet (50 mg total) by mouth every 4 (four) hours as needed for itching. 04/23/18   Marinda Elk, MD  ibuprofen (ADVIL,MOTRIN) 200 MG tablet Take 400 mg by mouth at bedtime as needed for moderate pain.    [provider]  metoprolol tartrate (LOPRESSOR) 25 MG tablet Take 25 mg by mouth daily.    [provider]  omeprazole (PRILOSEC) 40 MG capsule Take 40 mg by mouth daily. 04/17/17   [provider]  predniSONE (DELTASONE) 20 MG tablet Take by mouth daily. Taper 04/16/18   [provider]  triamcinolone cream (KENALOG) 0.1 % Apply 1 application topically daily as needed for itching. 04/16/18   [provider]  TURMERIC PO Take 2 tablets by mouth daily.    [provider]  umeclidinium bromide (INCRUSE ELLIPTA) 62.5 MCG/INH AEPB Inhale 1 puff into the lungs daily.    [provider]    Physical Exam: Vitals:   07/26/18 1950 07/26/18 2000 07-26-18 2030 07/26/18 2139  BP: 112/65 130/80 (!) 138/59 (!) 148/80  Pulse: 89 91  95  Resp: 18 16 20  (!) 22  Temp:    97.6 F (36.4 C)  TempSrc:      SpO2: 97% 100%  100%  Weight:    53.5 kg  Height:    5' (1.524 m)   General: Not in acute distress.  Mucus membrane HEENT:       Eyes: PERRL, EOMI, no scleral icterus.       ENT: No discharge from the ears and nose, no pharynx injection, no tonsillar enlargement.        Neck: No JVD, no bruit, no mass felt. Heme: No neck lymph node enlargement. Cardiac: S1/S2, RRR, No murmurs, No gallops or rubs. Respiratory: No rales, wheezing, rhonchi or rubs. GI: Soft, nondistended, nontender, no rebound pain, no organomegaly, BS present. GU: No hematuria Ext: has trace leg edema bilaterally. 2+DP/PT pulse bilaterally. Musculoskeletal: No joint deformities, No joint redness or warmth, no limitation of ROM in  spin. Skin: No rashes. Has bruises in both arms Neuro: Drowsy, but is oriented X3, cranial nerves II-XII grossly intact, moves all extremities normally. Psych: Patient is not psychotic, no suicidal or hemocidal ideation.  Labs on Admission: I have personally reviewed following labs and imaging studies  CBC: Recent Labs  Lab 07/26/2018 1739  WBC 5.5  NEUTROABS 4.4  HGB 9.6*  HCT 29.3*  MCV 91.8  PLT 178   Basic Metabolic Panel: Recent Labs  Lab 2018-07-26 1739 07/26/2018 1749  NA 133*  --   K 2.9*  --   CL 101  --   CO2 18*  --   GLUCOSE 105*  --   BUN 46*  --   CREATININE 2.50*  --   CALCIUM 8.8*  --   MG  --  2.2   GFR: Estimated Creatinine Clearance: 15.9 mL/min (A) (by C-G formula based on SCr  of 2.5 mg/dL (H)). Liver Function Tests: Recent Labs  Lab 07-26-2018 1739  AST 110*  ALT 43  ALKPHOS 145*  BILITOT 2.2*  PROT 6.5  ALBUMIN 2.9*   No results for input(s): LIPASE, AMYLASE in the last 168 hours. Recent Labs  Lab 07/26/2018 1739  AMMONIA 85*   Coagulation Profile: Recent Labs  Lab 07/11/18 0002  INR 1.46   Cardiac Enzymes: Recent Labs  Lab 2018/07/26 1739 07/11/18 0002  TROPONINI 0.04* 0.03*   BNP (last 3 results) No results for input(s): PROBNP in the last 8760 hours. HbA1C: Recent Labs    07/11/18 0004  HGBA1C 5.2   CBG: No results for input(s): GLUCAP in the last 168 hours. Lipid Profile: Recent Labs    07/11/18 0004  CHOL 185  HDL 10*  LDLCALC 145*  TRIG 149  CHOLHDL 18.5   Thyroid Function Tests: No results for input(s): TSH, T4TOTAL, FREET4, T3FREE, THYROIDAB in the last 72 hours. Anemia Panel: No results for input(s): VITAMINB12, FOLATE, FERRITIN, TIBC, IRON, RETICCTPCT in the last 72 hours. Urine analysis:    Component Value Date/Time   COLORURINE YELLOW 07-26-18 1739   APPEARANCEUR CLOUDY (A) 07/26/18 1739   LABSPEC 1.010 07/26/2018 1739   PHURINE 6.0 Jul 26, 2018 1739   GLUCOSEU NEGATIVE 2018/07/26 1739   HGBUR  NEGATIVE Jul 26, 2018 1739   BILIRUBINUR NEGATIVE 07-26-18 1739   KETONESUR NEGATIVE Jul 26, 2018 1739   PROTEINUR NEGATIVE 07-26-2018 1739   NITRITE NEGATIVE 07-26-2018 1739   LEUKOCYTESUR SMALL (A) July 26, 2018 1739   Sepsis Labs: (procalcitonin:4,lacticidven:4) )No results found for this or any previous visit (from the past 240 hour(s)).   Radiological Exams on Admission: No results found.   EKG: Independently reviewed.  Sinus rhythm, QTC 490, low voltage, anteroseptal infarction pattern.   Assessment/Plan Principal Problem:   Acute renal failure superimposed on stage 3 chronic kidney disease (HCC) Active Problems:   COPD (chronic obstructive pulmonary disease) (HCC)   Liver cirrhosis (HCC)   Dehydration   Hypokalemia   Elevated troponin   Alcohol use   Esophagitis   Normocytic anemia   Chronic diastolic CHF (congestive heart failure) (HCC)   Acute renal failure superimposed on stage 3 chronic kidney disease (HCC): Baseline Cre is 1/07 on 04/23/18, pt's Cre is 2.50 and BUN 46 on admission. Likely due to prerenal secondary to dehydration and continuation of diuretics, NSAIDs.  -place on tele bed for bos - IVF: 1L NS, then 100 cc/h - Follow up renal function by BMP - Check FeNa  - Hold lasix and ibuprofen  Liver cirrhosis (HCC): Ammonia level 85.  Patient is drowsy, but is still oriented x3, patient may have early stage IV hepatic encephalopathy. -Start lactulose 20 g 3 times daily -Check INR/PTT -Frequent neuro check  COPD (chronic obstructive pulmonary disease) (HCC): stable -Continue bronchodilators  Dehydration: -IVF as above  Hypokalemia: K= 2.9 and Mg 2.2 on admission. - Repleted with KCl  Elevated troponin: trop 0.04. No CP.  Likely due to demand ischemia in the setting of worsening renal function -Check UDS, A1c, FLP -Repeat EKG in morning -Follow-up troponin x3 -Will not start aspirin due to high risk of bleeding secondary to liver  cirrhosis -Continue metoprolol  Alcohol use: -CiWA  Esophagitis: -protonix  Normocytic anemia: Hemoglobin slightly dropped from 10.7 on 11/20- 19-9.6.  No bleeding actively. -Follow-up with CBC  Chronic diastolic CHF (congestive heart failure) (HCC): No 2D echo data available.  Patient has trace leg edema and mild shortness breath, but no acute respiratory distress.  No JVD.  CHF seems to be compensated. - Check BNP -hold lasix due to worsening renal function    DVT ppx: SCD Code Status: Full code Family Communication: None at bed side.     Disposition Plan:  Anticipate discharge back to previous home environment Consults called:  none Admission status: Obs / tele      Date of Service 07/11/2018    Lorretta Harp [facility]   If 7PM-7AM, please contact night-coverage [email or web address] Password [facility] 07/11/2018, 4:43 AM

## 2018-07-10 NOTE — ED Triage Notes (Signed)
Patient oxygen saturation 78% on RA.. placed on 2L [place]-oxygen level 97%

## 2018-07-10 NOTE — ED Notes (Signed)
Date and time results received: 2018-07-11 18:9 (use smartphrase ".now" to insert current time)  Test: trop Critical Value: 0.04  Name of Provider Notified: [email or web address]  Orders Received? Or Actions Taken?: notified

## 2018-07-11 ENCOUNTER — Other Ambulatory Visit: Payer: Self-pay

## 2018-07-11 ENCOUNTER — Encounter (HOSPITAL_COMMUNITY): Payer: Self-pay | Admitting: Internal Medicine

## 2018-07-11 ENCOUNTER — Observation Stay (HOSPITAL_COMMUNITY): Payer: Managed Care, Other (non HMO)

## 2018-07-11 DIAGNOSIS — I5032 Chronic diastolic (congestive) heart failure: Secondary | ICD-10-CM | POA: Diagnosis not present

## 2018-07-11 DIAGNOSIS — N179 Acute kidney failure, unspecified: Secondary | ICD-10-CM | POA: Diagnosis not present

## 2018-07-11 DIAGNOSIS — J449 Chronic obstructive pulmonary disease, unspecified: Secondary | ICD-10-CM | POA: Diagnosis not present

## 2018-07-11 DIAGNOSIS — E860 Dehydration: Secondary | ICD-10-CM

## 2018-07-11 DIAGNOSIS — Z7289 Other problems related to lifestyle: Secondary | ICD-10-CM | POA: Diagnosis not present

## 2018-07-11 LAB — RAPID URINE DRUG SCREEN, HOSP PERFORMED
Amphetamines: NOT DETECTED
Barbiturates: NOT DETECTED
Benzodiazepines: NOT DETECTED
Cocaine: NOT DETECTED
OPIATES: NOT DETECTED
Tetrahydrocannabinol: NOT DETECTED

## 2018-07-11 LAB — BASIC METABOLIC PANEL
Anion gap: 14 (ref 5–15)
BUN: 38 mg/dL — ABNORMAL HIGH (ref 8–23)
CO2: 15 mmol/L — ABNORMAL LOW (ref 22–32)
Calcium: 8.2 mg/dL — ABNORMAL LOW (ref 8.9–10.3)
Chloride: 110 mmol/L (ref 98–111)
Creatinine, Ser: 2.45 mg/dL — ABNORMAL HIGH (ref 0.44–1.00)
GFR calc Af Amer: 23 mL/min — ABNORMAL LOW (ref >60)
GFR, EST NON AFRICAN AMERICAN: 20 mL/min — AB (ref >60)
GLUCOSE: 81 mg/dL (ref 70–99)
Potassium: 3.7 mmol/L (ref 3.5–5.1)
Sodium: 139 mmol/L (ref 135–145)

## 2018-07-11 LAB — LIPID PANEL
Cholesterol: 185 mg/dL (ref 0–200)
HDL: 10 mg/dL — ABNORMAL LOW (ref >40)
LDL Cholesterol: 145 mg/dL — ABNORMAL HIGH (ref 0–99)
Total CHOL/HDL Ratio: 18.5 RATIO
Triglycerides: 149 mg/dL (ref <150)
VLDL: 30 mg/dL (ref 0–40)

## 2018-07-11 LAB — TROPONIN I
Troponin I: 0.03 ng/mL (ref <0.03)
Troponin I: 0.04 ng/mL (ref <0.03)
Troponin I: 0.04 ng/mL (ref <0.03)

## 2018-07-11 LAB — CBC
HCT: 29.9 % — ABNORMAL LOW (ref 36.0–46.0)
Hemoglobin: 9.8 g/dL — ABNORMAL LOW (ref 12.0–15.0)
MCH: 30.2 pg (ref 26.0–34.0)
MCHC: 32.8 g/dL (ref 30.0–36.0)
MCV: 92.3 fL (ref 80.0–100.0)
Platelets: 218 10*3/uL (ref 150–400)
RBC: 3.24 MIL/uL — ABNORMAL LOW (ref 3.87–5.11)
RDW: 18.1 % — ABNORMAL HIGH (ref 11.5–15.5)
WBC: 8.1 10*3/uL (ref 4.0–10.5)
nRBC: 0 % (ref 0.0–0.2)

## 2018-07-11 LAB — APTT: aPTT: 39 seconds — ABNORMAL HIGH (ref 24–36)

## 2018-07-11 LAB — SODIUM, URINE, RANDOM: Sodium, Ur: 17 mmol/L

## 2018-07-11 LAB — AMMONIA: Ammonia: 66 umol/L — ABNORMAL HIGH (ref 9–35)

## 2018-07-11 LAB — HEMOGLOBIN A1C
Hgb A1c MFr Bld: 5.2 % (ref 4.8–5.6)
Mean Plasma Glucose: 102.54 mg/dL

## 2018-07-11 LAB — PROTIME-INR
INR: 1.46
Prothrombin Time: 17.6 seconds — ABNORMAL HIGH (ref 11.4–15.2)

## 2018-07-11 LAB — CREATININE, URINE, RANDOM: CREATININE, URINE: 70.63 mg/dL

## 2018-07-11 LAB — BRAIN NATRIURETIC PEPTIDE: B Natriuretic Peptide: 480.1 pg/mL — ABNORMAL HIGH (ref 0.0–100.0)

## 2018-07-11 MED ORDER — OXYCODONE HCL 5 MG PO TABS
5.0000 mg | ORAL_TABLET | Freq: Four times a day (QID) | ORAL | Status: DC | PRN
  Administered 2018-07-11: 5 mg via ORAL
  Filled 2018-07-11: qty 1

## 2018-07-11 MED ORDER — LACTULOSE 10 GM/15ML PO SOLN: 20.0000 g | Freq: Two times a day (BID) | ORAL | Status: DC

## 2018-07-11 NOTE — Progress Notes (Addendum)
Progress Note    Alyssa Mckenzie  ZOX:[id number] DOB: July 27, 1950  DOA: 2018-08-02 PCP: Patient, No Pcp Per    Brief Narrative:   Chief complaint: Generalized weakness  Medical records reviewed and are as summarized below:  Alyssa Mckenzie is an 68 y.o. female who presents with complaints of decreased oral intake and generalized weakness after being diagnosed with candidal esophagitis for which she was taking Diflucan without improvement of symptoms.  Assessment/Plan:   Principal Problem:   Acute renal failure superimposed on stage 3 chronic kidney disease (HCC) Active Problems:   COPD (chronic obstructive pulmonary disease) (HCC)   Liver cirrhosis (HCC)   Dehydration   Hypokalemia   Elevated troponin   Alcohol use   Esophagitis   Normocytic anemia   Chronic diastolic CHF (congestive heart failure) (HCC)  Acute renal failure superimposed on chronic kidney disease stage III 2/2 dehydration: Patient's baseline creatinine noted to be 1.07 on 04/2018, but patient presented with a creatinine up to 2.5 BUN 46.  Patient had been started on IV fluids.  Repeat creatinine today noted to be 2.45 with BUN trending downward. -Continue normal saline IV fluids 125 mL/h -Recheck creatinine in a.m. -Hold nephrotoxic agents   Liver cirrhosis with hepatic encephalopathy: Acute.  Patient noted to be significantly drowsy although oriented x3. Records show recent ultrasound noting hepatic cirrhosis without focal lesion on 06/22/2018 on care everywhere. Admission MELD score around 23. ammonia levels trending down 85->66 question stage IV hepatic encephalopathy.  Patient was started on lactulose 20 g 3 times daily.   -Decrease lactulose to twice daily -Recheck ammonia level in a.m  Dysphagia, Candidal esophagitis: With recent history of candidal esophagitis for which she was taking Diflucan.  Still very poor p.o. intake. -Continue protonix -Nystatin -May warrant consulting gastroenterology, if  symptoms do not improve  Generalized weakness: Suspected secondary to above -Physical therapy to evaluate  Cough: Patient reports having a dry intermittent cough that is new. -Check chest x-ray  Alcohol abuse: Patient reports still drinking alcohol on a regular basis. -CIWA protocols  Normocytic normochromic anemia: Hemoglobin 9.8 which appears relatively stable when compared to previous.  Patient denies any reports of bleeding. -Recheck CBC  History of chronic diastolic CHF: Review of records on care everywhere noted last echocardiogram on 03/2018 at [facility] which just demonstrated normal EF with abnormal relaxation pattern.  Her pulmonary artery pressures were estimated to be between 40-50 mmHg.  Patient does not appear to be fluid overloaded, but BNP elevated at 480.1.  This could be related with history of elevated pulmonary artery pressures. -Strict I&O's and daily weights -Hold Lasix due to kidney injury  Elevated troponin: Acute.  Patient denies any complaints of chest pain.  Troponin is noted to be elevated up to 0.04. -Need to monitor   COPD: Patient without signs of acute exacerbation. -Continue bronchodilators  Hypokalemia: Resolved.  On admission potassium was 2.9, but was given potassium chloride 20 mEq IV and 40 mEq p.o. repeat check this morning within normal limits. -Continue to monitor and replace as needed    Body mass index is 22.4 kg/m.   Family Communication/Anticipated D/C date and plan/Code Status   DVT prophylaxis: SCDs for now Code Status: Full Code.  Family Communication: No family present at bedside Disposition Plan: Possible discharge home in 1 to 2 days if medically stable   Medical Consultants:    None.   Anti-Infectives:    None  Subjective:   Patient reports that he has had this  new dry intermittent cough and continued discomfort in her throat.  She has not been able to take in much food or liquids due to her throat discomfort.  She  also complains of some back pain for which she normally takes gabapentin.  Objective:    Vitals:   07/11/18 0554 07/11/18 0736 07/11/18 0818 07/11/18 1249  BP: 127/73  (!) 97/51 (!) 113/57  Pulse: (!) 108  97 94  Resp: 18  20 18   Temp: 97.6 F (36.4 C)  97.6 F (36.4 C) 97.8 F (36.6 C)  TempSrc:   Oral Oral  SpO2: 94% 98% 100% 98%  Weight: 52 kg     Height:        Intake/Output Summary (Last 24 hours) at 07/11/2018 1629 Last data filed at 07/11/2018 0900 Gross per 24 hour  Intake 2142.38 ml  Output 500 ml  Net 1642.38 ml   Filed Weights   07/09/2018 2139 07/11/18 0554  Weight: 53.5 kg 52 kg    Exam: Constitutional: Lethargic elderly appearing female who is able to be easily awakened and follows commands appropriately.   Eyes: PERRL, lids and conjunctivae normal ENMT: Mucous membranes are dry. Posterior pharynx clear of any exudate or lesions.   Neck: normal, supple, no masses, no thyromegaly.  No JVD Respiratory: clear to auscultation bilaterally, no wheezing, no crackles. Normal respiratory effort. No accessory muscle use.  Cardiovascular: Tachycardic, no murmurs / rubs / gallops. No extremity edema. 2+ pedal pulses. No carotid bruits.  Abdomen: no tenderness, no masses palpated. No hepatosplenomegaly. Bowel sounds positive.  Musculoskeletal: no clubbing / cyanosis. No joint deformity upper and lower extremities. Good ROM, no contractures. Normal muscle tone.  Skin: Significant bruising noted of the bilateral upper extremities. Neurologic: CN 2-12 grossly intact. Sensation intact, DTR normal. Strength 4+/5 in all 4.  Psychiatric:  lethargic, but oriented x 3. Normal mood.    Data Reviewed:   I have personally reviewed following labs and imaging studies:  Labs: Labs show the following:   Basic Metabolic Panel: Recent Labs  Lab 07/06/2018 1739 07/22/2018 1749 07/11/18 0612  NA 133*  --  139  K 2.9*  --  3.7  CL 101  --  110  CO2 18*  --  15*  GLUCOSE 105*  --  81    BUN 46*  --  38*  CREATININE 2.50*  --  2.45*  CALCIUM 8.8*  --  8.2*  MG  --  2.2  --    GFR Estimated Creatinine Clearance: 16.2 mL/min (A) (by C-G formula based on SCr of 2.45 mg/dL (H)). Liver Function Tests: Recent Labs  Lab 07/08/2018 1739  AST 110*  ALT 43  ALKPHOS 145*  BILITOT 2.2*  PROT 6.5  ALBUMIN 2.9*   No results for input(s): LIPASE, AMYLASE in the last 168 hours. Recent Labs  Lab 07/28/2018 1739 07/11/18 1247  AMMONIA 85* 66*   Coagulation profile Recent Labs  Lab 07/11/18 0002  INR 1.46    CBC: Recent Labs  Lab 07/24/2018 1739 07/11/18 0612  WBC 5.5 8.1  NEUTROABS 4.4  --   HGB 9.6* 9.8*  HCT 29.3* 29.9*  MCV 91.8 92.3  PLT 178 218   Cardiac Enzymes: Recent Labs  Lab 07/09/2018 1739 07/11/18 0002 07/11/18 0612 07/11/18 1247  TROPONINI 0.04* 0.03* 0.04* 0.04*   BNP (last 3 results) No results for input(s): PROBNP in the last 8760 hours. CBG: No results for input(s): GLUCAP in the last 168 hours. D-Dimer: No results  for input(s): DDIMER in the last 72 hours. Hgb A1c: Recent Labs    07/11/18 0004  HGBA1C 5.2   Lipid Profile: Recent Labs    07/11/18 0004  CHOL 185  HDL 10*  LDLCALC 145*  TRIG 149  CHOLHDL 18.5   Thyroid function studies: No results for input(s): TSH, T4TOTAL, T3FREE, THYROIDAB in the last 72 hours.  Invalid input(s): FREET3 Anemia work up: No results for input(s): VITAMINB12, FOLATE, FERRITIN, TIBC, IRON, RETICCTPCT in the last 72 hours. Sepsis Labs: Recent Labs  Lab 07/23/2018 1739 07/11/18 0612  WBC 5.5 8.1  LATICACIDVEN 1.7  --     Microbiology No results found for this or any previous visit (from the past 240 hour(s)).  Procedures and diagnostic studies:  No results found.  Medications:   . cholecalciferol  5,000 Units Oral Daily  . folic acid  1 mg Oral Daily  . gabapentin  400 mg Oral TID  . lactulose  20 g Oral TID  . LORazepam  0-4 mg Intravenous Q6H   Followed by  . [START ON  07/13/2018] LORazepam  0-4 mg Intravenous Q12H  . metoprolol tartrate  25 mg Oral Daily  . mometasone-formoterol  2 puff Inhalation BID  . multivitamin with minerals  1 tablet Oral Daily  . pantoprazole  40 mg Oral Daily  . thiamine  100 mg Oral Daily   Or  . thiamine  100 mg Intravenous Daily  . umeclidinium bromide  1 puff Inhalation Daily   Continuous Infusions: . sodium chloride Stopped (07/09/2018 2043)  . sodium chloride 100 mL/hr at 07/11/18 0016     LOS: 0 days   Rondell A Smith  [facility]   *Please refer to [email or web address], password [email or web address] to get updated schedule on who will round on this patient, as hospitalists switch teams weekly. If 7PM-7AM, please contact night-coverage at [email or web address], password [email or web address] for any overnight needs.

## 2018-07-11 NOTE — Progress Notes (Signed)
Patient has been very sleepy this shift. Has not taken any food from meal trays. Difficult to get meds in.  Patient has had little result from lactulose . Small bowel movement  This am.  IV fluids continue at 125/hr.  Pt turns self but assisted with repositioning.   No evidence of CIWA, has not required the prescribed Ativan as ordered.

## 2018-07-12 ENCOUNTER — Observation Stay (HOSPITAL_COMMUNITY): Payer: Managed Care, Other (non HMO)

## 2018-07-12 DIAGNOSIS — E860 Dehydration: Secondary | ICD-10-CM | POA: Diagnosis not present

## 2018-07-12 DIAGNOSIS — J449 Chronic obstructive pulmonary disease, unspecified: Secondary | ICD-10-CM | POA: Diagnosis not present

## 2018-07-12 DIAGNOSIS — R188 Other ascites: Secondary | ICD-10-CM

## 2018-07-12 DIAGNOSIS — D638 Anemia in other chronic diseases classified elsewhere: Secondary | ICD-10-CM

## 2018-07-12 DIAGNOSIS — Z7289 Other problems related to lifestyle: Secondary | ICD-10-CM | POA: Diagnosis not present

## 2018-07-12 DIAGNOSIS — N179 Acute kidney failure, unspecified: Secondary | ICD-10-CM | POA: Diagnosis not present

## 2018-07-12 DIAGNOSIS — K7030 Alcoholic cirrhosis of liver without ascites: Secondary | ICD-10-CM | POA: Diagnosis not present

## 2018-07-12 DIAGNOSIS — K7290 Hepatic failure, unspecified without coma: Secondary | ICD-10-CM | POA: Diagnosis not present

## 2018-07-12 LAB — CBC WITH DIFFERENTIAL/PLATELET
Abs Immature Granulocytes: 0 10*3/uL (ref 0.00–0.07)
Basophils Absolute: 0 10*3/uL (ref 0.0–0.1)
Basophils Relative: 0 %
Eosinophils Absolute: 0.3 10*3/uL (ref 0.0–0.5)
Eosinophils Relative: 4 %
HCT: 28.1 % — ABNORMAL LOW (ref 36.0–46.0)
Hemoglobin: 9.2 g/dL — ABNORMAL LOW (ref 12.0–15.0)
Lymphocytes Relative: 4 %
Lymphs Abs: 0.3 10*3/uL — ABNORMAL LOW (ref 0.7–4.0)
MCH: 30.9 pg (ref 26.0–34.0)
MCHC: 32.7 g/dL (ref 30.0–36.0)
MCV: 94.3 fL (ref 80.0–100.0)
MONOS PCT: 3 %
Monocytes Absolute: 0.2 10*3/uL (ref 0.1–1.0)
NEUTROS PCT: 89 %
Neutro Abs: 6 10*3/uL (ref 1.7–7.7)
Platelets: 173 10*3/uL (ref 150–400)
RBC: 2.98 MIL/uL — ABNORMAL LOW (ref 3.87–5.11)
RDW: 18.8 % — ABNORMAL HIGH (ref 11.5–15.5)
WBC: 6.7 10*3/uL (ref 4.0–10.5)
nRBC: 0 % (ref 0.0–0.2)
nRBC: 0 /100 WBC

## 2018-07-12 LAB — BASIC METABOLIC PANEL
Anion gap: 13 (ref 5–15)
BUN: 40 mg/dL — ABNORMAL HIGH (ref 8–23)
CO2: 16 mmol/L — ABNORMAL LOW (ref 22–32)
Calcium: 8.3 mg/dL — ABNORMAL LOW (ref 8.9–10.3)
Chloride: 114 mmol/L — ABNORMAL HIGH (ref 98–111)
Creatinine, Ser: 2.39 mg/dL — ABNORMAL HIGH (ref 0.44–1.00)
GFR calc Af Amer: 24 mL/min — ABNORMAL LOW (ref >60)
GFR calc non Af Amer: 20 mL/min — ABNORMAL LOW (ref >60)
Glucose, Bld: 92 mg/dL (ref 70–99)
Potassium: 3.2 mmol/L — ABNORMAL LOW (ref 3.5–5.1)
Sodium: 143 mmol/L (ref 135–145)

## 2018-07-12 LAB — AMMONIA: Ammonia: 83 umol/L — ABNORMAL HIGH (ref 9–35)

## 2018-07-12 MED ORDER — LACTULOSE ENEMA
300.0000 mL | Freq: Two times a day (BID) | ORAL | Status: DC
  Filled 2018-07-12 (×2): qty 300

## 2018-07-12 MED ORDER — NYSTATIN 100000 UNIT/ML MT SUSP
5.0000 mL | Freq: Four times a day (QID) | OROMUCOSAL | Status: DC
  Administered 2018-07-12 – 2018-07-14 (×3): 500000 [IU] via ORAL
  Filled 2018-07-12 (×6): qty 5

## 2018-07-12 MED ORDER — RIFAXIMIN 550 MG PO TABS
550.0000 mg | ORAL_TABLET | Freq: Two times a day (BID) | ORAL | Status: DC
  Administered 2018-07-12 – 2018-07-18 (×12): 550 mg
  Filled 2018-07-12 (×15): qty 1

## 2018-07-12 MED ORDER — POTASSIUM CHLORIDE CRYS ER 20 MEQ PO TBCR: 40.0000 meq | EXTENDED_RELEASE_TABLET | Freq: Every day | ORAL | Status: DC

## 2018-07-12 MED ORDER — SODIUM BICARBONATE 8.4 % IV SOLN
INTRAVENOUS | Status: DC
  Administered 2018-07-12: 22:00:00 via INTRAVENOUS
  Filled 2018-07-12 (×5): qty 150

## 2018-07-12 MED ORDER — POTASSIUM CHLORIDE 20 MEQ/15ML (10%) PO SOLN
40.0000 meq | Freq: Every day | ORAL | Status: DC
  Administered 2018-07-13: 40 meq via ORAL
  Filled 2018-07-12 (×2): qty 30

## 2018-07-12 MED ORDER — LACTULOSE 10 GM/15ML PO SOLN
20.0000 g | Freq: Three times a day (TID) | ORAL | Status: DC
  Filled 2018-07-12: qty 30

## 2018-07-12 MED ORDER — LACTULOSE 10 GM/15ML PO SOLN
30.0000 g | Freq: Four times a day (QID) | ORAL | Status: DC
  Administered 2018-07-12 – 2018-07-13 (×4): 30 g via ORAL
  Filled 2018-07-12 (×4): qty 45

## 2018-07-12 NOTE — Progress Notes (Signed)
Called at 0915 for second set of eyes for eval - unable to respond immediately - patient in NAD per RN Morrie Sheldon - called back by RN at 0930 stating MD at bedside and reports no acute change.  No need for RRT response - will place on radar.

## 2018-07-12 NOTE — Consult Note (Signed)
[facility]    NEPHROLOGY CONSULTATION NOTE  PATIENT ID:  Debbe Odeaynthia Mccoy, DOB:  [birth year]  HPI: The patient is a 68 y.o. year old female who presented with a complaint of decreased oral intake and generalized weakness after being diagnosed with candidal esophagitis.  She was apparently taking Diflucan but had no improvement in her symptoms.  Her baseline serum creatinine runs around 1.07 from November 2019.  On admission, she was noted to have a serum creatinine up to 2.5.  She was started on IV fluids.  She had some improvement in her serum creatinine to 2.39.  She had some possible interval development of mild pulmonary edema.  Renal consultation has been called for acute kidney injury.   Past Medical History:  Diagnosis Date  . CHF (congestive heart failure) (HCC)   . COPD (chronic obstructive pulmonary disease) (HCC)   . Liver failure [facility](HCC)     Past Surgical History:  Procedure Laterality Date  . ABDOMINAL HYSTERECTOMY    . BLADDER SURGERY    . BREAST SURGERY    . CHOLECYSTECTOMY    . TONSILLECTOMY      Family History  Problem Relation Age of Onset  . Congestive Heart Failure Father     Social History   Tobacco Use  . Smoking status: Former Games developermoker  . Smokeless tobacco: Never Used  Substance Use Topics  . Alcohol use: Yes    Alcohol/week: 4.0 standard drinks    Types: 4 Glasses of wine per week    Comment: 4 glasses/day  . Drug use: Never    REVIEW OF SYSTEMS: Review of systems limited to confusion.  Denies chest pain, shortness of breath, nausea, vomiting, or other symptoms.    PHYSICAL EXAM:  Vitals:   07/12/18 0923 07/12/18 1116  BP: 138/68 (!) 131/56  Pulse: 96 97  Resp: 18 18  Temp:  97.6 F (36.4 C)  SpO2: 100% 93%   I/O last 3 completed shifts: In: 4695.6 [P.O.:480; I.V.:3120.6; IV Piggyback:1094.9] Out: 650 [Urine:650]   General: Alert to self, NAD HEENT: MMM [place] AT anicteric sclera Neck:  No JVD, no adenopathy CV:  Heart  RRR  Lungs:  L/S CTA bilaterally Abd:  abd SNT/ND with normal BS GU:  Bladder non-palpable Extremities:  No LE edema. Skin:  No skin rash Psych:  normal mood and affect Neuro:  no focal deficits   CURRENT MEDICATIONS:  . cholecalciferol  5,000 Units Oral Daily  . folic acid  1 mg Oral Daily  . lactulose  30 g Oral Q6H  . metoprolol tartrate  25 mg Oral Daily  . mometasone-formoterol  2 puff Inhalation BID  . multivitamin with minerals  1 tablet Oral Daily  . nystatin  5 mL Oral QID  . pantoprazole  40 mg Oral Daily  . potassium chloride  40 mEq Oral Daily  . rifaximin  550 mg Per Tube BID  . thiamine  100 mg Oral Daily   Or  . thiamine  100 mg Intravenous Daily  . umeclidinium bromide  1 puff Inhalation Daily     HOME MEDICATIONS:  Prior to Admission medications   Medication Sig Start Date End Date Taking? Authorizing Provider  albuterol (PROVENTIL HFA;VENTOLIN HFA) 108 (90 Base) MCG/ACT inhaler Inhale into the lungs every 6 (six) hours as needed for wheezing or shortness of breath.   Yes [provider]  Cholecalciferol (VITAMIN D3) 125 MCG (5000 UT) TABS Take 1 tablet by mouth daily.   Yes [provider]  fluticasone-salmeterol (ADVAIR HFA) 115-21 MCG/ACT inhaler Inhale 2 puffs into the lungs 2 (two) times daily.   Yes [provider]  furosemide (LASIX) 20 MG tablet Take 20 mg by mouth daily.   Yes [provider]  gabapentin (NEURONTIN) 400 MG capsule Take 800 mg by mouth 3 (three) times daily. 04/11/18  Yes [provider]  hydrOXYzine (ATARAX/VISTARIL) 50 MG tablet Take 1 tablet (50 mg total) by mouth every 4 (four) hours as needed for itching. 04/23/18  Yes Marinda ElkFeliz Ortiz, Abraham, MD  ibuprofen (ADVIL,MOTRIN) 200 MG tablet Take 400 mg by mouth at bedtime as needed for moderate pain.   Yes [provider]  metoprolol tartrate (LOPRESSOR) 25 MG tablet Take 25 mg by mouth daily.   Yes [provider]  omeprazole  (PRILOSEC) 40 MG capsule Take 40 mg by mouth daily. 04/17/17  Yes [provider]  ondansetron (ZOFRAN-ODT) 4 MG disintegrating tablet Take 4 mg by mouth every 6 (six) hours as needed for nausea.  07/09/18  Yes [provider]  triamcinolone cream (KENALOG) 0.1 % Apply 1 application topically daily as needed for itching. 04/16/18  Yes [provider]  TURMERIC PO Take 2 tablets by mouth daily.   Yes [provider]  umeclidinium bromide (INCRUSE ELLIPTA) 62.5 MCG/INH AEPB Inhale 1 puff into the lungs daily.   Yes [provider]  predniSONE (DELTASONE) 20 MG tablet Take by mouth daily. Taper 04/16/18   [provider]       LABS:  CBC Latest Ref Rng & Units 07/12/2018 07/11/2018 07/24/2018  WBC 4.0 - 10.5 K/uL 6.7 8.1 5.5  Hemoglobin 12.0 - 15.0 g/dL 0.9(W9.2(L) 1.1(B9.8(L) 1.4(N9.6(L)  Hematocrit 36.0 - 46.0 % 28.1(L) 29.9(L) 29.3(L)  Platelets 150 - 400 K/uL 173 218 178    CMP Latest Ref Rng & Units 07/12/2018 07/11/2018 07/30/2018  Glucose 70 - 99 mg/dL 92 81 829(F105(H)  BUN 8 - 23 mg/dL 62(Z40(H) 30(Q38(H) 65(H46(H)  Creatinine 0.44 - 1.00 mg/dL 8.46(N2.39(H) 6.29(B2.45(H) 2.84(X2.50(H)  Sodium 135 - 145 mmol/L 143 139 133(L)  Potassium 3.5 - 5.1 mmol/L 3.2(L) 3.7 2.9(L)  Chloride 98 - 111 mmol/L 114(H) 110 101  CO2 22 - 32 mmol/L 16(L) 15(L) 18(L)  Calcium 8.9 - 10.3 mg/dL 8.3(L) 8.2(L) 8.8(L)  Total Protein 6.5 - 8.1 g/dL - - 6.5  Total Bilirubin 0.3 - 1.2 mg/dL - - 2.2(H)  Alkaline Phos 38 - 126 U/L - - 145(H)  AST 15 - 41 U/L - - 110(H)  ALT 0 - 44 U/L - - 43    Lab Results  Component Value Date   CALCIUM 8.3 (L) 07/12/2018       Component Value Date/Time   COLORURINE YELLOW 09/11/18 1739   APPEARANCEUR CLOUDY (A) 09/11/18 1739   LABSPEC 1.010 09/11/18 1739   PHURINE 6.0 09/11/18 1739   GLUCOSEU NEGATIVE 09/11/18 1739   HGBUR NEGATIVE 09/11/18 1739   BILIRUBINUR NEGATIVE 09/11/18 1739   KETONESUR NEGATIVE 09/11/18 1739   PROTEINUR NEGATIVE 09/11/18 1739    NITRITE NEGATIVE 09/11/18 1739   LEUKOCYTESUR SMALL (A) 09/11/18 1739   No results found for: PHART, PCO2ART, PO2ART, HCO3, TCO2, ACIDBASEDEF, O2SAT  No results found for: IRON, TIBC, FERRITIN, IRONPCTSAT     ASSESSMENT/PLAN:     Problem List Items Addressed This Visit      Other   Hypokalemia    Other Visit Diagnoses    AKI (acute kidney injury) (HCC)    -  Primary  Cough       Relevant Orders   DG CHEST PORT 1 VIEW (Completed)   ARF (acute renal failure) (HCC)       Relevant Orders   US RENAL (Completed)      1.  Chronic kidney disease stage III with a baseline serum creatinine of 1.07 from 04/2018.  2.  Acute kidney injury.  Likely on the basis of prolonged volume depletion with a component of ATN.  Urine was negative for blood or protein, arguing against any intrinsic renal disease.  Urine sodium was less than 17, consistent with a prerenal azotemia.  She did respond to IV fluids.  Her slow improvement is likely secondary to a component of ATN.  It appears she is not able to tolerate much p.o.  Will administer D5 with bicarb for now.  3.  Cirrhosis.  Continue lactulose.  With improving creatinine, no clear evidence of type I hepatorenal syndrome.  No significant ascites on examination.  4.  Anemia.  No evidence of occult bleeding.  Is normocytic.  5.  History of chronic diastolic heart failure.  Chest x-ray with some minimal increase in edema with IV fluids.  Is oxygenating well.    [address] [place], DO, [place]

## 2018-07-12 NOTE — Progress Notes (Addendum)
Call placed to RR RN to lay extra eyes on pt, due to RN unfamiliarity with pt. States she will evaluate next.  Page to MD. [phone] requesting eyes on pt to determine +/- changes in neuro. RN unfamiliar with pt.    07/12/18 0923  Vitals  BP 138/68  MAP (mmHg) 88  BP Location Left Arm  BP Method Automatic  Patient Position (if appropriate) Lying  Pulse Rate 96  Pulse Rate Source Dinamap  Resp 18  Oxygen Therapy  SpO2 100 %  O2 Device Nasal Cannula  O2 Flow Rate (L/min) 3 L/min  MEWS Score  MEWS RR 0  MEWS Pulse 0  MEWS Systolic 0  MEWS LOC 0  MEWS Temp 0  MEWS Score 0  MEWS Score Color Green   MD at bedside, pt does not seem much different from 02/08 neuro status.  Call placed to RR RN to inform MD was at bedside, it is no longer necessary for her to evaluate at this time. RR RN will place on radar for follow ups.

## 2018-07-12 NOTE — Progress Notes (Signed)
NG tube placement attempted at bedside per order. Attempt was unsuccessful. Patient is unable to swallow and physically fight and pulling at NG tube once it was almost completely placed. With 3 RNs at bedside we had to prevent the patient from self-harm. Her nose began to bleed during this time. Per Dr. Myrtie Neither we ended the attempt for risk of bleed or injury. Radiology called for assistance as instructed by Dr. Myrtie Neither, but they are unable to assist at this time.

## 2018-07-12 NOTE — Progress Notes (Signed)
Call from [place] regarding renal US order; updated on pt status and inability to medicate due to lethargy. Pt should not leave unit unattended given her CIWA precautions/risk for seizure. Pt would however benefit from attending [place] prior to any rectal/alternate route lactulose once ordered; as the effects could impair [place] ability to obtain scan.

## 2018-07-12 NOTE — Progress Notes (Signed)
NGT advanced  >4 cm. Placement confirmed with RN Lawerance Cruel. Small amount of dark brown gastric drainage noted in tube. Patient tolerated procedure well.

## 2018-07-12 NOTE — Progress Notes (Addendum)
Dr. Katrinka Blazing called and was updated on patient status. NG tube placed by SWOT. Xray in room to confirm placement. SWOT also placing rectal tube at this time. Patient found to be transported from 3E on bed pan. Bottom red, lotion applied.

## 2018-07-12 NOTE — Progress Notes (Signed)
Patient and RN return to the floor s/p [place].

## 2018-07-12 NOTE — Progress Notes (Signed)
Lactulose orders changed by Dr Katrinka Blazing as pt is unable to consume by mouth.   Needed supplies requested from materials, awaiting pharmacy review and supplies arrival on floor.

## 2018-07-12 NOTE — Progress Notes (Signed)
Patient arrived 4E on a hospital bed from 3E, assessment completed see flowsheet, placed on tele ccmd notified, patient oriented to room and staff, bed in lowest position, call bell within reach, will continue to monitor.

## 2018-07-12 NOTE — Consult Note (Addendum)
[facility] Consult Note   History Alyssa OdeaCynthia Mckenzie MRN # [id number]  Date of Admission: 07/30/2018 Date of Consultation: 07/12/2018 Referring physician: Dr. Clydie BraunSmith, Rondell A, MD Primary Care Provider: Patient, No Pcp Per Primary Gastroenterologist: Dr. Rochel Bromeavid [facility])   Reason for Consultation/Chief Complaint: Decompensated cirrhosis with hepatic encephalopathy  Subjective  HPI:  This is a 68 year old woman who receives her chronic GI/hepatology care with the practice in the [facility] system, admitted here the night before last from home for generalized weakness and fatigue.  She was found to have acute renal failure and altered mental status with elevated ammonia level.  She has been treated with lactulose and IV fluids but renal function and mental status have not improved.  Nephrology consult has been placed, and I was called this afternoon for consult.  Patient cannot provide any history or review of systems right now because she is somnolent and confused.  There is history available in the care everywhere link.  There is an office note from her primary GI practice on January 17 indicating dysphagia and odynophagia.  She underwent upper endoscopy January 21 and reportedly had esophageal candidiasis treated with 10 days of Diflucan, which she has since finished.  At home for about the last several days she has apparently had generalized weakness and fatigue, decreased oral intake but continued use of furosemide.  ROS:  Unable to obtain due to altered mental status  Past Medical History Past Medical History:  Diagnosis Date  . CHF (congestive heart failure) (HCC)   . COPD (chronic obstructive pulmonary disease) (HCC)   . Liver failure [facility](HCC)     Past Surgical History Past Surgical History:  Procedure Laterality Date  . ABDOMINAL HYSTERECTOMY    . BLADDER SURGERY    . BREAST SURGERY    . CHOLECYSTECTOMY    . TONSILLECTOMY      Family History Family History   Problem Relation Age of Onset  . Congestive Heart Failure Father     Social History Social History   Socioeconomic History  . Marital status: Widowed    Spouse name: Not on file  . Number of children: Not on file  . Years of education: Not on file  . Highest education level: Not on file  Occupational History  . Not on file  Social Needs  . Financial resource strain: Not on file  . Food insecurity:    Worry: Not on file    Inability: Not on file  . Transportation needs:    Medical: Not on file    Non-medical: Not on file  Tobacco Use  . Smoking status: Former Games developermoker  . Smokeless tobacco: Never Used  Substance and Sexual Activity  . Alcohol use: Yes    Alcohol/week: 4.0 standard drinks    Types: 4 Glasses of wine per week    Comment: 4 glasses/day  . Drug use: Never  . Sexual activity: Not on file  Lifestyle  . Physical activity:    Days per week: Not on file    Minutes per session: Not on file  . Stress: Not on file  Relationships  . Social connections:    Talks on phone: Not on file    Gets together: Not on file    Attends religious service: Not on file    Active member of club or organization: Not on file    Attends meetings of clubs or organizations: Not on file    Relationship status: Not on file  Other Topics Concern  .  Not on file  Social History Narrative  . Not on file    Allergies Allergies  Allergen Reactions  . Statins Other (See Comments)    aching    Outpatient Meds Home medications from the H+P and/or nursing med reconciliation reviewed.  Inpatient med list reviewed Of note, the patient was on lactulose 20 g twice daily but it was discontinued because of her poor mental status and reported difficulty getting the patient to take anything by mouth.  Lactulose enema was then ordered. _____________________________________________________________________ Objective   Exam:  Current vital signs  Patient Vitals for the past 8 hrs:  BP  Temp Temp src Pulse Resp SpO2  07/12/18 1116 (!) 131/56 97.6 F (36.4 C) Oral 97 18 93 %  07/12/18 0923 138/68 - - 96 18 100 %    Intake/Output Summary (Last 24 hours) at 07/12/2018 1615 Last data filed at 07/12/2018 0900 Gross per 24 hour  Intake 3089.7 ml  Output 150 ml  Net 2939.7 ml    Physical Exam:    General: this is a stuporous, frail, chronically ill female patient   Eyes: sclera anicteric, no redness  ENT: oral mucosa well visualized, no cervical or supraclavicular lymphadenopathy  CV: RRR without murmur, S1/S2, no JVD,, no peripheral edema  Resp: Prolonged expiratory phase, poor inspiratory effort.  No audible crackles or wheezes   GI: soft, no obvious tenderness, with active bowel sounds. No guarding or palpable organomegaly noted  Skin; multiple ecchymoses, pale, no jaundice multiple spider nevi upper chest wall  Neuro: Responds to pain, mumbles incoherently, stuporous  Labs:  CBC Latest Ref Rng & Units 07/12/2018 07/11/2018 07-16-18  WBC 4.0 - 10.5 K/uL 6.7 8.1 5.5  Hemoglobin 12.0 - 15.0 g/dL 8.1(J) 0.3(P) 5.9(Y)  Hematocrit 36.0 - 46.0 % 28.1(L) 29.9(L) 29.3(L)  Platelets 150 - 400 K/uL 173 218 178    CMP Latest Ref Rng & Units 07/12/2018 07/11/2018 July 16, 2018  Glucose 70 - 99 mg/dL 92 81 585(F)  BUN 8 - 23 mg/dL 29(W) 44(Q) 28(M)  Creatinine 0.44 - 1.00 mg/dL 3.81(R) 7.11(A) 5.79(U)  Sodium 135 - 145 mmol/L 143 139 133(L)  Potassium 3.5 - 5.1 mmol/L 3.2(L) 3.7 2.9(L)  Chloride 98 - 111 mmol/L 114(H) 110 101  CO2 22 - 32 mmol/L 16(L) 15(L) 18(L)  Calcium 8.9 - 10.3 mg/dL 8.3(L) 8.2(L) 8.8(L)  Total Protein 6.5 - 8.1 g/dL - - 6.5  Total Bilirubin 0.3 - 1.2 mg/dL - - 2.2(H)  Alkaline Phos 38 - 126 U/L - - 145(H)  AST 15 - 41 U/L - - 110(H)  ALT 0 - 44 U/L - - 43    Care everywhere labs from 06/19/2018: Glucose 106, BUN 21, creatinine 1.79, sodium 143, potassium 3.7, bicarb 97, AST 83, ALT 44, alkaline phosphatase 132, total bilirubin 0.7  On 07/09/2018: BUN  42, creatinine 2.46, potassium 2.8, bicarb 15, AST 93, ALT 37, alkaline phosphatase 146, total bilirubin 1.4  Recent Labs  Lab 07/11/18 0002  INR 1.46  INR 1.12 in Nov 2019  Radiologic studies:  Chest x-ray last evening: "Emphysematous changes with new interstitial infiltrates in the mid to lower lungs, favor pulmonary edema though infection is not completely excluded."   Last CT abdomen and pelvis on file with [place] from November 2019 shows cirrhotic appearing liver no ascites.  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 Acute renal failure that has not improved with volume administration.  Concern for hepatorenal  syndrome and patient who was on diuretics for unclear cause in the setting of poor oral intake for several days.  Severe hepatic encephalopathy.  Patient at risk for respiratory compromise.  Anemia of chronic disease.  Stable since admission, no reports of active GI bleeding.  Plan:  I have ordered NG tube placement for administration of lactulose 30 gm every 6 hours.  If nursing is unable to do that, they must send patient down to the radiology department for it to be done under fluoroscopy this evening.  Rifaximin 550 mg twice daily through the NG tube  This patient needs to be monitored closely with frequent checks because she is at high risk of further decompensation of mental status and respiratory failure.  I have conveyed this clearly to the nursing staff.  Agree with nephrology consultation  Strongly recommend avoiding benzodiazepines and opiates, as they will adversely affect this patient's already compromised mental status.  Our practice will follow the patient.  Dr. Leone Payor is on-call for our practice overnight and can be called as needed.  In the morning, Dr. Ileene Patrick will assume the consult service.   Thank you for the courtesy of this  consult.  Please contact me with any questions or concerns.  Charlie Pitter III Office: [phone]

## 2018-07-12 NOTE — Progress Notes (Signed)
Patient accompanied to [place] by this RN.

## 2018-07-12 NOTE — Progress Notes (Signed)
Low bed requested for patient given her CIWA protocol and continued drowsiness.

## 2018-07-12 NOTE — Evaluation (Signed)
Physical Therapy Evaluation Patient Details Name: Alyssa Mckenzie MRN: [id number] DOB: Apr 09, 1951 Today's Date: 07/12/2018   History of Present Illness  68 y.o. female with medical history significant of dCHF, COPD, GERD, liver cirrhosis, CKD 3, alcohol use, anemia, who presents with generalized weakness and fatigue.  Pt admitted for acute renal failure and dehydration. Elevated ammonia on admission.    Clinical Impression  Pt admitted with above diagnosis. Pt currently with functional limitations due to the deficits listed below (see PT Problem List). Per chart, PTA pt lived at home with her sister, independent with mobility and ADLs. On eval, pt very lethargic, unable to provide history. Answers "I usually do ok" to all questions asked. She required max assist bed mobility and mod assist to maintain sitting balance EOB. Pt on 2 L O2 with SpO2 85%. O2 increased to 3 L with SpO2 increase to 90%.  RN notified. Pt will benefit from skilled PT to increase their independence and safety with mobility to allow discharge to the venue listed below.       Follow Up Recommendations SNF;Supervision/Assistance - 24 hour    Equipment Recommendations  Other (comment)(TBD)    Recommendations for Other Services       Precautions / Restrictions Precautions Precautions: Fall      Mobility  Bed Mobility Overal bed mobility: Needs Assistance Bed Mobility: Supine to Sit;Sit to Supine     Supine to sit: Max assist;HOB elevated Sit to supine: Max assist;HOB elevated   General bed mobility comments: cues for sequencing and to stay awake. Pt trembling/teeth chattering upon sitting EOB.   Transfers                 General transfer comment: unable  Ambulation/Gait             General Gait Details: unable  Stairs            Wheelchair Mobility    Modified Rankin (Stroke Patients Only)       Balance Overall balance assessment: Needs assistance Sitting-balance support: Feet  supported;No upper extremity supported Sitting balance-Leahy Scale: Fair Sitting balance - Comments: mod assist to maintain sitting balance EOB                                     Pertinent Vitals/Pain Pain Assessment: No/denies pain    Home Living Family/patient expects to be discharged to:: Private residence Living Arrangements: Other relatives(sister)               Additional Comments: Pt unable to provide further history or home set up. No family present. Pt reports living with her sister.    Prior Function Level of Independence: Independent         Comments: independent per chart     Hand Dominance        Extremity/Trunk Assessment   Upper Extremity Assessment Upper Extremity Assessment: Generalized weakness    Lower Extremity Assessment Lower Extremity Assessment: Generalized weakness    Cervical / Trunk Assessment Cervical / Trunk Assessment: Kyphotic  Communication      Cognition Arousal/Alertness: Lethargic Behavior During Therapy: Flat affect Overall Cognitive Status: No family/caregiver present to determine baseline cognitive functioning                                 General Comments: Pt answers "I usually do ok"  to all questions asked.      General Comments General comments (skin integrity, edema, etc.): possible withdrawal due to alcohol use. RN reports pt was up and down to [facility] overnight with nursing assist.    Exercises     Assessment/Plan    PT Assessment Patient needs continued PT services  PT Problem List Decreased strength;Decreased balance;Decreased cognition;Decreased knowledge of precautions;Decreased mobility;Decreased knowledge of use of DME;Decreased activity tolerance;Decreased safety awareness       PT Treatment Interventions DME instruction;Functional mobility training;Balance training;Patient/family education;Gait training;Therapeutic activities;Therapeutic exercise;Stair  training;Cognitive remediation    PT Goals (Current goals can be found in the Care Plan section)  Acute Rehab PT Goals Patient Stated Goal: not stated PT Goal Formulation: Patient unable to participate in goal setting Time For Goal Achievement: 07/26/18 Potential to Achieve Goals: Fair    Frequency Min 3X/week   Barriers to discharge        Co-evaluation               AM-PAC PT "6 Clicks" Mobility  Outcome Measure Help needed turning from your back to your side while in a flat bed without using bedrails?: A Lot Help needed moving from lying on your back to sitting on the side of a flat bed without using bedrails?: A Lot Help needed moving to and from a bed to a chair (including a wheelchair)?: A Lot Help needed standing up from a chair using your arms (e.g., wheelchair or bedside chair)?: A Lot Help needed to walk in hospital room?: Total Help needed climbing 3-5 steps with a railing? : Total 6 Click Score: 10    End of Session Equipment Utilized During Treatment: Oxygen Activity Tolerance: Patient limited by lethargy Patient left: in bed;with call bell/phone within reach;with nursing/sitter in room;with bed alarm set Nurse Communication: Mobility status;Other (comment)(SpO2) PT Visit Diagnosis: Other abnormalities of gait and mobility (R26.89);Muscle weakness (generalized) (M62.81)    Time: 2831-5176 PT Time Calculation (min) (ACUTE ONLY): 12 min   Charges:   PT Evaluation $PT Eval Low Complexity: 1 Low          Aida Raider, PT  Office # [phone] Pager [phone]   Ilda Foil 07/12/2018, 9:27 AM

## 2018-07-12 NOTE — Progress Notes (Signed)
Patient sleeping during shift report.      

## 2018-07-12 NOTE — Progress Notes (Signed)
11e08 [place] pt still not alert enough to confidently administer po meds. please consider alternate route rx. thanks HCPOA at bedside.

## 2018-07-12 NOTE — [place] FL2 (Signed)
[place] MEDICAID FL2 LEVEL OF CARE SCREENING TOOL     IDENTIFICATION  Patient Name: Alyssa Mckenzie Birthdate: 16-Nov-1950 Sex: female Admission Date (Current Location): 07/20/2018  [facility] [place]   Facility and Address:  [facility] [place], [address], [place], [place] [address]      Provider Number: [id number]  Attending Physician Name and Address:  Clydie BraunSmith, Rondell A, MD  Relative Name and Phone Number:  Marion DownerScott Bennett, [phone]    Current Level of Care: Hospital Recommended Level of Care: Skilled Nursing Facility Prior Approval Number:    Date Approved/Denied:   PASRR Number: [id number]  Discharge Plan: SNF    Current Diagnoses: Patient Active Problem List   Diagnosis Date Noted  . Dehydration 07/09/18  . Hypokalemia 07/09/18  . Elevated troponin 07/09/18  . Alcohol use 07/09/18  . Esophagitis 07/09/18  . Normocytic anemia 07/09/18  . Chronic diastolic CHF (congestive heart failure) (HCC) 07/09/18  . Acute renal failure superimposed on stage 3 chronic kidney disease (HCC) 04/21/2018  . COPD (chronic obstructive pulmonary disease) (HCC) 04/21/2018  . Acute diastolic HF (heart failure) (HCC) 04/21/2018  . Liver cirrhosis (HCC) 04/21/2018  . Itching 04/21/2018    Orientation RESPIRATION BLADDER Height & Weight     Self, Time, Situation, Place  O2(nasal cannula 2L) Continent Weight: 52 kg Height:  5' (152.4 cm)  BEHAVIORAL SYMPTOMS/MOOD NEUROLOGICAL BOWEL NUTRITION STATUS      Incontinent Diet(please see DC summary)  AMBULATORY STATUS COMMUNICATION OF NEEDS Skin   Extensive Assist Verbally Normal                       Personal Care Assistance Level of Assistance  Bathing, Feeding, Dressing Bathing Assistance: Maximum assistance Feeding assistance: Limited assistance Dressing Assistance: Maximum assistance     Functional Limitations Info  Sight, Hearing, Speech Sight Info:  Adequate Hearing Info: Adequate Speech Info: Adequate    SPECIAL CARE FACTORS FREQUENCY  PT (By licensed PT)     PT Frequency: 5x/week              Contractures Contractures Info: Not present    Additional Factors Info  Code Status, Allergies Code Status Info: Full Allergies Info: Statins           Current Medications (07/12/2018):  This is the current hospital active medication list Current Facility-Administered Medications  Medication Dose Route Frequency Provider Last Rate Last Dose  . 0.9 %  sodium chloride infusion   Intravenous PRN Lorretta HarpNiu, Xilin, MD   Stopped at 03/17/19 2043  . 0.9 %  sodium chloride infusion   Intravenous Continuous Madelyn FlavorsSmith, Rondell A, MD 75 mL/hr at 07/12/18 0933    . albuterol (PROVENTIL) (2.5 MG/3ML) 0.083% nebulizer solution 2.5 mg  2.5 mg Nebulization Q4H PRN Lorretta HarpNiu, Xilin, MD      . cholecalciferol (VITAMIN D3) tablet 5,000 Units  5,000 Units Oral Daily Lorretta HarpNiu, Xilin, MD   Stopped at 07/12/18 [phone]  . folic acid (FOLVITE) tablet 1 mg  1 mg Oral Daily Lorretta HarpNiu, Xilin, MD   Stopped at 07/12/18 [phone]  . gabapentin (NEURONTIN) capsule 400 mg  400 mg Oral TID Lorretta HarpNiu, Xilin, MD   Stopped at 07/12/18 [phone]  . hydrALAZINE (APRESOLINE) injection 5 mg  5 mg Intravenous Q2H PRN Lorretta HarpNiu, Xilin, MD      . hydrOXYzine (ATARAX/VISTARIL) tablet 50 mg  50 mg Oral Q4H PRN Lorretta HarpNiu, Xilin, MD   50 mg at 07/11/18 0007  .  lactulose (CHRONULAC) 10 GM/15ML solution 20 g  20 g Oral TID Madelyn Flavors A, MD      . LORazepam (ATIVAN) injection 0-4 mg  0-4 mg Intravenous Q6H Lorretta Harp, MD       Followed by  . [START ON 07/13/2018] LORazepam (ATIVAN) injection 0-4 mg  0-4 mg Intravenous Q12H Lorretta Harp, MD      . LORazepam (ATIVAN) tablet 1 mg  1 mg Oral Q6H PRN Lorretta Harp, MD       Or  . LORazepam (ATIVAN) injection 1 mg  1 mg Intravenous Q6H PRN Lorretta Harp, MD      . metoprolol tartrate (LOPRESSOR) tablet 25 mg  25 mg Oral Daily Lorretta Harp, MD   Stopped at 07/12/18 [phone]  . mometasone-formoterol  (DULERA) 200-5 MCG/ACT inhaler 2 puff  2 puff Inhalation BID Lorretta Harp, MD   2 puff at 07/12/18 0254  . multivitamin with minerals tablet 1 tablet  1 tablet Oral Daily Lorretta Harp, MD   Stopped at 07/12/18 [phone]  . nystatin (MYCOSTATIN) 100000 UNIT/ML suspension 500,000 Units  5 mL Oral QID Madelyn Flavors A, MD   500,000 Units at 07/12/18 1221  . ondansetron (ZOFRAN) tablet 4 mg  4 mg Oral Q6H PRN Lorretta Harp, MD       Or  . ondansetron [facility]) injection 4 mg  4 mg Intravenous Q6H PRN Lorretta Harp, MD      . oxyCODONE (Oxy IR/ROXICODONE) immediate release tablet 5 mg  5 mg Oral Q6H PRN Madelyn Flavors A, MD   5 mg at 07/11/18 2220  . pantoprazole (PROTONIX) EC tablet 40 mg  40 mg Oral Daily Lorretta Harp, MD   Stopped at 07/12/18 [phone]  . potassium chloride 20 MEQ/15ML (10%) solution 40 mEq  40 mEq Oral Daily Smith, Rondell A, MD      . thiamine (VITAMIN B-1) tablet 100 mg  100 mg Oral Daily Lorretta Harp, MD   100 mg at 07/11/18 2831   Or  . thiamine (B-1) injection 100 mg  100 mg Intravenous Daily Lorretta Harp, MD   100 mg at 07/12/18 1212  . umeclidinium bromide (INCRUSE ELLIPTA) 62.5 MCG/INH 1 puff  1 puff Inhalation Daily Lorretta Harp, MD   1 puff at 07/12/18 5176     Discharge Medications: Please see discharge summary for a list of discharge medications.  Relevant Imaging Results:  Relevant Lab Results:   Additional Information SSN: [id number]  Abigail Butts, LCSW

## 2018-07-12 NOTE — Progress Notes (Signed)
NG tube placement attempted again at bedside. Unsuccessful. Dr. Katrinka Blazing notified. Told him I spoke to radiologist who was unable to place at this time. Stated he will look into other means of having tube placed. Pt being transferred to higher level of care.

## 2018-07-12 NOTE — Progress Notes (Addendum)
Progress Note    Alyssa Mckenzie  ZOX:[id number] DOB: 04/13/1951  DOA: July 20, 2018 PCP: Patient, No Pcp Per    Brief Narrative:   Chief complaint: Generalized weakness  Medical records reviewed and are as summarized below:  Alyssa Mckenzie is an 68 y.o. female who presents with complaints of decreased oral intake and generalized weakness after being diagnosed with candidal esophagitis for which she was taking Diflucan without improvement of symptoms.  Assessment/Plan:   Principal Problem:   Acute renal failure superimposed on stage 3 chronic kidney disease (HCC) Active Problems:   COPD (chronic obstructive pulmonary disease) (HCC)   Liver cirrhosis (HCC)   Dehydration   Hypokalemia   Elevated troponin   Alcohol use   Esophagitis   Normocytic anemia   Chronic diastolic CHF (congestive heart failure) (HCC)  1.  Acute renal failure superimposed on chronic kidney disease stage III 2/2 dehydration: Patient's baseline creatinine noted to be 1.07 on 04/2018, but patient presented with a creatinine up to 2.5 BUN 46 on admission.  Patient had been started on IV fluids.  Creatinine appears to slowly be trending down 2.5->2.45-> 2.39 today.  Overall patient received total of approximately 4 L since admission.   -Normal saline IV fluids changed from 125 down to 75 mL/h.  Initially suspected symptoms related with significant dehydration, but questioning possibility of hepatorenal syndrome. -Check renal ultrasound -Recheck creatinine in a.m. -Hold nephrotoxic agents   -Consulted Dr. Orlie Dakin of nephrology  -Recheck urine creatinine, urea, and sodium per recommendation  2.  Liver cirrhosis with hepatic encephalopathy: worsening.  Patient noted to be significantly drowsy although oriented x3. Records show recent ultrasound noting hepatic cirrhosis without focal lesion on 06/22/2018 on care everywhere. Admission MELD score around 23. ammonia levels fluctuating 85->66->85stage IV hepatic  encephalopathy.  Patient only noted to have small bowel movement yesterday.  -Aspiration precaution -Transfer to Progressive bed for closer monitoring -Neuro checks -Elevate head of bed -NTG  -Increase Lactulose per GI recommends -Consult GI given continued continued dysphagia and liver failure for further recommendations  3.  Dysphagia, Candidal esophagitis: With recent history of candidal esophagitis for which she was taking Diflucan and was reported to have completed course on 2/4.  Still very poor p.o. intake. -Continue protonix -Nystatin  4.  Generalized weakness: Suspected secondary to above.  Physical therapy reporting that patient was needing full assist. -Appreciate physical therapy appetite of services  5.  Dyspnea: Patient reports having a dry intermittent cough that is new.  Chest x-ray from 2/8 noted show emphysematous changes with signs of new infiltrates concerning for edema of the mid to lower lung fields.  6.  Alcohol abuse: Patient reports still drinking alcohol on a regular basis. -CIWA protocols with Ativan as needed  7.  Normocytic normochromic anemia: Hemoglobin 9.8->9.2 today.  Patient denies any reports of bleeding. -Recheck CBC  8.  History of chronic diastolic CHF: Review of records on care everywhere noted last echocardiogram on 03/2018 at [facility] which just demonstrated normal EF with abnormal relaxation pattern.  Her pulmonary artery pressures were estimated to be between 40-50 mmHg.  Patient does not appear to be fluid overloaded, but BNP elevated at 480.1.  This could be related with history of elevated pulmonary artery pressures. -Strict I&O's and daily weights -Hold Lasix due to kidney injury  9.  Elevated troponin: Acute.  Patient denies any complaints of chest pain.  Troponin is noted to be elevated up to 0.04. -Need to monitor   10.  COPD:  Patient without signs of acute exacerbation. -Continue bronchodilators  11.  Hypokalemia: Acute on chronic.  On  admission potassium was 2.9, but continues to intermittently have low potassium levels.  Currently not on any diuretics. -Potassium chloride 40 mEq daily -Will adjust dose as needed    Body mass index is 22.4 kg/m.   Family Communication/Anticipated D/C date and plan/Code Status   DVT prophylaxis: SCDs for now Code Status: Full Code.  Family Communication: No family present at bedside Disposition Plan: Possible discharge home in 1 to 2 days if medically stable   Medical Consultants:    Nephrology and gastroenterology   Anti-Infectives:    None  Subjective:   Patient reports that he has had this new dry intermittent cough and continued discomfort in her throat.  She has not been able to take in much food or liquids due to her throat discomfort.  She also complains of some back pain for which she normally takes gabapentin.  Objective:    Vitals:   07/11/18 1657 07/11/18 1915 07/11/18 2006 07/12/18 0618  BP: 110/60  124/60 (!) 121/57  Pulse: 84  94 93  Resp: 18  20 16   Temp: 98.2 F (36.8 C)  (!) 97.5 F (36.4 C) 98.1 F (36.7 C)  TempSrc: Oral   Oral  SpO2: 97% 95% 94% 96%  Weight:      Height:        Intake/Output Summary (Last 24 hours) at 07/12/2018 0710 Last data filed at 07/12/2018 1610 Gross per 24 hour  Intake 2793.18 ml  Output 150 ml  Net 2643.18 ml   Filed Weights   07/05/2018 2139 07/11/18 0554  Weight: 53.5 kg 52 kg    Exam: Constitutional: More lethargic elderly appearing female in no acute distress. Eyes: PERRL, lids and conjunctivae normal ENMT: Mucous membranes are dry. Posterior pharynx clear of any exudate or lesions.   Neck: normal, supple, no masses, no thyromegaly.  No JVD Respiratory: Decreased overall inspiratory and expiratory efforts.  No wheezes appreciated.  Positive crackles in the lower lung fields.  Patient currently on 3 L nasal cannula oxygen. Cardiovascular: Regular rate and rhythm, no murmurs / rubs / gallops. No lower  extremity edema. 2+ pedal pulses. No carotid bruits.  Abdomen: Mild abdominal distention positive fluid wave, no masses palpated. No hepatosplenomegaly. Bowel sounds positive.  Musculoskeletal: no clubbing / cyanosis. No joint deformity upper and lower extremities. Good ROM, no contractures. Normal muscle tone.  Skin: Significant bruising noted of the bilateral upper extremities. Neurologic: CN 2-12 grossly intact. Sensation intact, DTR normal. Strength 4+/5 in all 4.  Psychiatric:  lethargic, but arousable with repeated stimuli.   Data Reviewed:   I have personally reviewed following labs and imaging studies:  Labs: Labs show the following:   Basic Metabolic Panel: Recent Labs  Lab 07/22/2018 1739 07/16/2018 1749 07/11/18 0612 07/12/18 0450  NA 133*  --  139 143  K 2.9*  --  3.7 3.2*  CL 101  --  110 114*  CO2 18*  --  15* 16*  GLUCOSE 105*  --  81 92  BUN 46*  --  38* 40*  CREATININE 2.50*  --  2.45* 2.39*  CALCIUM 8.8*  --  8.2* 8.3*  MG  --  2.2  --   --    GFR Estimated Creatinine Clearance: 16.6 mL/min (A) (by C-G formula based on SCr of 2.39 mg/dL (H)). Liver Function Tests: Recent Labs  Lab 07/09/2018 1739  AST 110*  ALT 43  ALKPHOS 145*  BILITOT 2.2*  PROT 6.5  ALBUMIN 2.9*   No results for input(s): LIPASE, AMYLASE in the last 168 hours. Recent Labs  Lab 07/27/2018 1739 07/11/18 1247 07/12/18 0450  AMMONIA 85* 66* 83*   Coagulation profile Recent Labs  Lab 07/11/18 0002  INR 1.46    CBC: Recent Labs  Lab 07/12/2018 1739 07/11/18 0612 07/12/18 0450  WBC 5.5 8.1 6.7  NEUTROABS 4.4  --  PENDING  HGB 9.6* 9.8* 9.2*  HCT 29.3* 29.9* 28.1*  MCV 91.8 92.3 94.3  PLT 178 218 173   Cardiac Enzymes: Recent Labs  Lab 07/16/2018 1739 07/11/18 0002 07/11/18 0612 07/11/18 1247  TROPONINI 0.04* 0.03* 0.04* 0.04*   BNP (last 3 results) No results for input(s): PROBNP in the last 8760 hours. CBG: No results for input(s): GLUCAP in the last 168  hours. D-Dimer: No results for input(s): DDIMER in the last 72 hours. Hgb A1c: Recent Labs    07/11/18 0004  HGBA1C 5.2   Lipid Profile: Recent Labs    07/11/18 0004  CHOL 185  HDL 10*  LDLCALC 145*  TRIG 149  CHOLHDL 18.5   Thyroid function studies: No results for input(s): TSH, T4TOTAL, T3FREE, THYROIDAB in the last 72 hours.  Invalid input(s): FREET3 Anemia work up: No results for input(s): VITAMINB12, FOLATE, FERRITIN, TIBC, IRON, RETICCTPCT in the last 72 hours. Sepsis Labs: Recent Labs  Lab 07/14/2018 1739 07/11/18 0612 07/12/18 0450  WBC 5.5 8.1 6.7  LATICACIDVEN 1.7  --   --     Microbiology No results found for this or any previous visit (from the past 240 hour(s)).  Procedures and diagnostic studies:  Dg Chest Port 1 View  Result Date: 07/11/2018 CLINICAL DATA:  Cough, shortness of breath, history COPD, CHF EXAM: PORTABLE CHEST 1 VIEW COMPARISON:  Portable exam 1723 hours without priors for comparison Correlation: CT abdomen and pelvis 04/21/2018 FINDINGS: Normal heart size, mediastinal contours, and pulmonary vascularity. Atherosclerotic calcification aorta. Emphysematous changes in the upper lobes with interstitial infiltrates in the mid to lower lungs bilaterally question pulmonary edema, new. No pleural effusion or pneumothorax. Bones demineralized. IMPRESSION: Emphysematous changes with new interstitial infiltrates in the mid to lower lungs, favor pulmonary edema though infection is not completely excluded. Electronically Signed   By: Ulyses SouthwardMark  Boles M.D.   On: 07/11/2018 18:09    Medications:   . cholecalciferol  5,000 Units Oral Daily  . folic acid  1 mg Oral Daily  . gabapentin  400 mg Oral TID  . lactulose  20 g Oral BID  . LORazepam  0-4 mg Intravenous Q6H   Followed by  . [START ON 07/13/2018] LORazepam  0-4 mg Intravenous Q12H  . metoprolol tartrate  25 mg Oral Daily  . mometasone-formoterol  2 puff Inhalation BID  . multivitamin with minerals  1  tablet Oral Daily  . nystatin  5 mL Oral QID  . pantoprazole  40 mg Oral Daily  . thiamine  100 mg Oral Daily   Or  . thiamine  100 mg Intravenous Daily  . umeclidinium bromide  1 puff Inhalation Daily   Continuous Infusions: . sodium chloride Stopped (07/29/2018 2043)  . sodium chloride 125 mL/hr at 07/12/18 0623     LOS: 0 days   Rondell A Smith  [facility]   *Please refer to [email or web address], password [email or web address] to get updated schedule on who will round on this patient, as hospitalists switch teams weekly. If 7PM-7AM, please contact  night-coverage at [email or web address], password [email or web address] for any overnight needs.

## 2018-07-13 DIAGNOSIS — J9621 Acute and chronic respiratory failure with hypoxia: Secondary | ICD-10-CM | POA: Diagnosis not present

## 2018-07-13 DIAGNOSIS — J449 Chronic obstructive pulmonary disease, unspecified: Secondary | ICD-10-CM | POA: Diagnosis not present

## 2018-07-13 DIAGNOSIS — E874 Mixed disorder of acid-base balance: Secondary | ICD-10-CM | POA: Diagnosis not present

## 2018-07-13 DIAGNOSIS — J15211 Pneumonia due to Methicillin susceptible Staphylococcus aureus: Secondary | ICD-10-CM | POA: Diagnosis not present

## 2018-07-13 DIAGNOSIS — I5032 Chronic diastolic (congestive) heart failure: Secondary | ICD-10-CM | POA: Diagnosis present

## 2018-07-13 DIAGNOSIS — I248 Other forms of acute ischemic heart disease: Secondary | ICD-10-CM | POA: Diagnosis present

## 2018-07-13 DIAGNOSIS — I371 Nonrheumatic pulmonary valve insufficiency: Secondary | ICD-10-CM | POA: Diagnosis not present

## 2018-07-13 DIAGNOSIS — E870 Hyperosmolality and hypernatremia: Secondary | ICD-10-CM | POA: Diagnosis not present

## 2018-07-13 DIAGNOSIS — I519 Heart disease, unspecified: Secondary | ICD-10-CM | POA: Diagnosis not present

## 2018-07-13 DIAGNOSIS — I130 Hypertensive heart and chronic kidney disease with heart failure and stage 1 through stage 4 chronic kidney disease, or unspecified chronic kidney disease: Secondary | ICD-10-CM | POA: Diagnosis present

## 2018-07-13 DIAGNOSIS — K7682 Hepatic encephalopathy: Secondary | ICD-10-CM

## 2018-07-13 DIAGNOSIS — G9341 Metabolic encephalopathy: Secondary | ICD-10-CM | POA: Diagnosis not present

## 2018-07-13 DIAGNOSIS — K7290 Hepatic failure, unspecified without coma: Secondary | ICD-10-CM

## 2018-07-13 DIAGNOSIS — R0609 Other forms of dyspnea: Secondary | ICD-10-CM | POA: Diagnosis not present

## 2018-07-13 DIAGNOSIS — Z6824 Body mass index (BMI) 24.0-24.9, adult: Secondary | ICD-10-CM | POA: Diagnosis not present

## 2018-07-13 DIAGNOSIS — D6489 Other specified anemias: Secondary | ICD-10-CM | POA: Diagnosis present

## 2018-07-13 DIAGNOSIS — J9601 Acute respiratory failure with hypoxia: Secondary | ICD-10-CM | POA: Diagnosis not present

## 2018-07-13 DIAGNOSIS — I471 Supraventricular tachycardia: Secondary | ICD-10-CM | POA: Diagnosis not present

## 2018-07-13 DIAGNOSIS — K7040 Alcoholic hepatic failure without coma: Secondary | ICD-10-CM | POA: Diagnosis present

## 2018-07-13 DIAGNOSIS — A4151 Sepsis due to Escherichia coli [E. coli]: Secondary | ICD-10-CM | POA: Diagnosis not present

## 2018-07-13 DIAGNOSIS — N179 Acute kidney failure, unspecified: Secondary | ICD-10-CM

## 2018-07-13 DIAGNOSIS — N183 Chronic kidney disease, stage 3 (moderate): Secondary | ICD-10-CM | POA: Diagnosis not present

## 2018-07-13 DIAGNOSIS — R627 Adult failure to thrive: Secondary | ICD-10-CM | POA: Diagnosis present

## 2018-07-13 DIAGNOSIS — Y95 Nosocomial condition: Secondary | ICD-10-CM | POA: Diagnosis not present

## 2018-07-13 DIAGNOSIS — I82409 Acute embolism and thrombosis of unspecified deep veins of unspecified lower extremity: Secondary | ICD-10-CM | POA: Diagnosis not present

## 2018-07-13 DIAGNOSIS — R6521 Severe sepsis with septic shock: Secondary | ICD-10-CM | POA: Diagnosis not present

## 2018-07-13 DIAGNOSIS — Z7289 Other problems related to lifestyle: Secondary | ICD-10-CM | POA: Diagnosis not present

## 2018-07-13 DIAGNOSIS — D631 Anemia in chronic kidney disease: Secondary | ICD-10-CM | POA: Diagnosis present

## 2018-07-13 DIAGNOSIS — K7030 Alcoholic cirrhosis of liver without ascites: Secondary | ICD-10-CM | POA: Diagnosis present

## 2018-07-13 DIAGNOSIS — I272 Pulmonary hypertension, unspecified: Secondary | ICD-10-CM | POA: Diagnosis not present

## 2018-07-13 DIAGNOSIS — K219 Gastro-esophageal reflux disease without esophagitis: Secondary | ICD-10-CM | POA: Diagnosis present

## 2018-07-13 DIAGNOSIS — K767 Hepatorenal syndrome: Secondary | ICD-10-CM | POA: Diagnosis not present

## 2018-07-13 DIAGNOSIS — J440 Chronic obstructive pulmonary disease with acute lower respiratory infection: Secondary | ICD-10-CM | POA: Diagnosis present

## 2018-07-13 DIAGNOSIS — N170 Acute kidney failure with tubular necrosis: Secondary | ICD-10-CM | POA: Diagnosis present

## 2018-07-13 DIAGNOSIS — B3781 Candidal esophagitis: Secondary | ICD-10-CM | POA: Diagnosis present

## 2018-07-13 DIAGNOSIS — J438 Other emphysema: Secondary | ICD-10-CM | POA: Diagnosis not present

## 2018-07-13 DIAGNOSIS — J9622 Acute and chronic respiratory failure with hypercapnia: Secondary | ICD-10-CM | POA: Diagnosis not present

## 2018-07-13 LAB — CBC WITH DIFFERENTIAL/PLATELET
ABS IMMATURE GRANULOCYTES: 0.08 10*3/uL — AB (ref 0.00–0.07)
Basophils Absolute: 0 10*3/uL (ref 0.0–0.1)
Basophils Relative: 0 %
Eosinophils Absolute: 0 10*3/uL (ref 0.0–0.5)
Eosinophils Relative: 1 %
HEMATOCRIT: 26.4 % — AB (ref 36.0–46.0)
Hemoglobin: 8.9 g/dL — ABNORMAL LOW (ref 12.0–15.0)
Immature Granulocytes: 1 %
Lymphocytes Relative: 6 %
Lymphs Abs: 0.5 10*3/uL — ABNORMAL LOW (ref 0.7–4.0)
MCH: 31.2 pg (ref 26.0–34.0)
MCHC: 33.7 g/dL (ref 30.0–36.0)
MCV: 92.6 fL (ref 80.0–100.0)
Monocytes Absolute: 0.4 10*3/uL (ref 0.1–1.0)
Monocytes Relative: 5 %
NEUTROS ABS: 7.8 10*3/uL — AB (ref 1.7–7.7)
Neutrophils Relative %: 87 %
Platelets: 175 10*3/uL (ref 150–400)
RBC: 2.85 MIL/uL — ABNORMAL LOW (ref 3.87–5.11)
RDW: 18.8 % — ABNORMAL HIGH (ref 11.5–15.5)
WBC: 8.8 10*3/uL (ref 4.0–10.5)
nRBC: 0 % (ref 0.0–0.2)

## 2018-07-13 LAB — COMPREHENSIVE METABOLIC PANEL
ALT: 66 U/L — ABNORMAL HIGH (ref 0–44)
AST: 211 U/L — AB (ref 15–41)
Albumin: 2.4 g/dL — ABNORMAL LOW (ref 3.5–5.0)
Alkaline Phosphatase: 136 U/L — ABNORMAL HIGH (ref 38–126)
Anion gap: 9 (ref 5–15)
BUN: 38 mg/dL — ABNORMAL HIGH (ref 8–23)
CO2: 16 mmol/L — ABNORMAL LOW (ref 22–32)
Calcium: 8.3 mg/dL — ABNORMAL LOW (ref 8.9–10.3)
Chloride: 123 mmol/L — ABNORMAL HIGH (ref 98–111)
Creatinine, Ser: 1.88 mg/dL — ABNORMAL HIGH (ref 0.44–1.00)
GFR calc Af Amer: 32 mL/min — ABNORMAL LOW (ref >60)
GFR calc non Af Amer: 27 mL/min — ABNORMAL LOW (ref >60)
GLUCOSE: 144 mg/dL — AB (ref 70–99)
Potassium: 3 mmol/L — ABNORMAL LOW (ref 3.5–5.1)
Sodium: 148 mmol/L — ABNORMAL HIGH (ref 135–145)
Total Bilirubin: 2.4 mg/dL — ABNORMAL HIGH (ref 0.3–1.2)
Total Protein: 5.8 g/dL — ABNORMAL LOW (ref 6.5–8.1)

## 2018-07-13 LAB — MAGNESIUM: Magnesium: 1.8 mg/dL (ref 1.7–2.4)

## 2018-07-13 LAB — AMMONIA: Ammonia: 184 umol/L — ABNORMAL HIGH (ref 9–35)

## 2018-07-13 MED ORDER — VITAMIN B-1 100 MG PO TABS
100.0000 mg | ORAL_TABLET | Freq: Every day | ORAL | Status: DC
  Administered 2018-07-14 – 2018-07-17 (×4): 100 mg
  Filled 2018-07-13 (×4): qty 1

## 2018-07-13 MED ORDER — FOLIC ACID 1 MG PO TABS
1.0000 mg | ORAL_TABLET | Freq: Every day | ORAL | Status: DC
  Administered 2018-07-14 – 2018-07-18 (×5): 1 mg
  Filled 2018-07-13 (×5): qty 1

## 2018-07-13 MED ORDER — LACTULOSE 10 GM/15ML PO SOLN
30.0000 g | Freq: Four times a day (QID) | ORAL | Status: DC
  Administered 2018-07-13 – 2018-07-18 (×18): 30 g
  Filled 2018-07-13 (×19): qty 45

## 2018-07-13 MED ORDER — ALBUTEROL SULFATE (2.5 MG/3ML) 0.083% IN NEBU: 2.5000 mg | INHALATION_SOLUTION | RESPIRATORY_TRACT | Status: DC | PRN

## 2018-07-13 MED ORDER — DEXTROSE-NACL 5-0.45 % IV SOLN
INTRAVENOUS | Status: DC
  Administered 2018-07-13 – 2018-07-14 (×2): via INTRAVENOUS

## 2018-07-13 MED ORDER — METOPROLOL TARTRATE 12.5 MG HALF TABLET
12.5000 mg | ORAL_TABLET | Freq: Every day | ORAL | Status: DC
  Administered 2018-07-14 – 2018-07-15 (×2): 12.5 mg
  Filled 2018-07-13 (×2): qty 1

## 2018-07-13 MED ORDER — FREE WATER
200.0000 mL | Freq: Four times a day (QID) | Status: DC
  Administered 2018-07-13 – 2018-07-14 (×4): 200 mL

## 2018-07-13 MED ORDER — PANTOPRAZOLE SODIUM 40 MG PO PACK
40.0000 mg | PACK | Freq: Every day | ORAL | Status: DC
  Administered 2018-07-14 – 2018-07-18 (×5): 40 mg
  Filled 2018-07-13 (×7): qty 20

## 2018-07-13 MED ORDER — POTASSIUM CHLORIDE 20 MEQ/15ML (10%) PO SOLN
40.0000 meq | Freq: Two times a day (BID) | ORAL | Status: AC
  Administered 2018-07-13 – 2018-07-14 (×3): 40 meq
  Filled 2018-07-13 (×3): qty 30

## 2018-07-13 NOTE — Progress Notes (Signed)
[facility]    NEPHROLOGY PROGRESS NOTE  SUBJECTIVE: Resting comfortably without complaints.  No acute events.     OBJECTIVE:  Vitals:   07/13/18 0950 07/13/18 1100  BP: (!) 156/74   Pulse: (!) 117   Resp:  (!) 23  Temp: 98.4 F (36.9 C)   SpO2: 93%     Intake/Output Summary (Last 24 hours) at 07/13/2018 1306 Last data filed at 07/13/2018 1200 Gross per 24 hour  Intake 1283.08 ml  Output 725 ml  Net 558.08 ml      Gen[place]l: Resting NAD HEENT: MMM [place] AT anicteric sclera Neck:  No JVD, no adenopathy CV: Heart regular rate and rhythm Lungs: Equal chest excursion, normal respiratory effort Abd:  abd SNT/ND with normal BS GU:  Bladder non-palpable Extremities:  No LE edema. Skin:  No skin rash  MEDICATIONS:  . cholecalciferol  5,000 Units Oral Daily  . folic acid  1 mg Oral Daily  . lactulose  30 g Oral Q6H  . metoprolol tartrate  25 mg Oral Daily  . mometasone-formoterol  2 puff Inhalation BID  . multivitamin with minerals  1 tablet Oral Daily  . nystatin  5 mL Oral QID  . pantoprazole  40 mg Oral Daily  . potassium chloride  40 mEq Oral Daily  . rifaximin  550 mg Per Tube BID  . thiamine  100 mg Oral Daily   Or  . thiamine  100 mg Intravenous Daily  . umeclidinium bromide  1 puff Inhalation Daily       LABS:   CBC Latest Ref Rng & Units 07/13/2018 07/12/2018 07/11/2018  WBC 4.0 - 10.5 K/uL 8.8 6.7 8.1  Hemoglobin 12.0 - 15.0 g/dL 9.5(G) 3.8(V) 5.6(E)  Hematocrit 36.0 - 46.0 % 26.4(L) 28.1(L) 29.9(L)  Platelets 150 - 400 K/uL 175 173 218    CMP Latest Ref Rng & Units 07/13/2018 07/12/2018 07/11/2018  Glucose 70 - 99 mg/dL 332(R) 92 81  BUN 8 - 23 mg/dL 51(O) 84(Z) 66(A)  Creatinine 0.44 - 1.00 mg/dL 6.30(Z) 6.01(U) 9.32(T)  Sodium 135 - 145 mmol/L 148(H) 143 139  Potassium 3.5 - 5.1 mmol/L 3.0(L) 3.2(L) 3.7  Chloride 98 - 111 mmol/L 123(H) 114(H) 110  CO2 22 - 32 mmol/L 16(L) 16(L) 15(L)  Calcium 8.9 - 10.3 mg/dL 8.3(L) 8.3(L) 8.2(L)  Total  Protein 6.5 - 8.1 g/dL 5.5(D) - -  Total Bilirubin 0.3 - 1.2 mg/dL 2.4(H) - -  Alkaline Phos 38 - 126 U/L 136(H) - -  AST 15 - 41 U/L 211(H) - -  ALT 0 - 44 U/L 66(H) - -    Lab Results  Component Value Date   CALCIUM 8.3 (L) 07/13/2018       Component Value Date/Time   COLORURINE YELLOW 07/29/2018 1739   APPEARANCEUR CLOUDY (A) 07/22/2018 1739   LABSPEC 1.010 07/30/2018 1739   PHURINE 6.0 07/09/2018 1739   GLUCOSEU NEGATIVE 07/08/2018 1739   HGBUR NEGATIVE 07/09/2018 1739   BILIRUBINUR NEGATIVE 07/26/2018 1739   KETONESUR NEGATIVE 07/06/2018 1739   PROTEINUR NEGATIVE 07/09/2018 1739   NITRITE NEGATIVE 07/27/2018 1739   LEUKOCYTESUR SMALL (A) 07/29/2018 1739   No results found for: PHART, PCO2ART, PO2ART, HCO3, TCO2, ACIDBASEDEF, O2SAT  No results found for: IRON, TIBC, FERRITIN, IRONPCTSAT     ASSESSMENT/PLAN:       1.  Chronic kidney disease stage III with a baseline serum creatinine of 1.07 from 04/2018.  2.  Acute kidney injury.  Likely on the basis  of prolonged volume depletion with a component of ATN.  Urine was negative for blood or protein, arguing against any intrinsic renal disease.  Urine sodium was less than 17, consistent with a prerenal azotemia.  She did respond to IV fluids.  Her slow improvement is likely secondary to a component of ATN.  It appears she is not able to tolerate much p.o.   will DC IV fluids for now.  3.  Cirrhosis.  Continue lactulose.  With improving creatinine, no clear evidence of type I hepatorenal syndrome.  No significant ascites on examination.  4.  Anemia.  No evidence of occult bleeding.  Is normocytic.  5.  History of chronic diastolic heart failure.  Chest x-ray with some minimal increase in edema with IV fluids.  Is oxygenating well.  6.  Mixed gap and non-anion gap metabolic acidosis.  Likely secondary to acute kidney injury.  Renal function is improving.  We will hold off on IV fluids as she appears euvolemic.    [address]  [place], DO, [place]

## 2018-07-13 NOTE — Progress Notes (Signed)
Progress Note   Subjective  Chief Complaint: Decompensated cirrhosis with hepatic encephalopathy  This morning patient is found somnolent, she does have NG tube in place.  Also rectal tube in place.  Per nursing patient was able to state her name and her birthday earlier, but I could not get her to speak to me, only open her eyes.  Nursing denies any acute changes overnight.  They have been giving Lactulose and Rifaximin.   Objective   Vital signs in last 24 hours: Temp:  [97.6 F (36.4 C)-98.6 F (37 C)] 98.5 F (36.9 C) (02/10 0441) Pulse Rate:  [97-116] 116 (02/10 0441) Resp:  [18-20] 18 (02/10 0441) BP: (117-138)/(43-70) 138/61 (02/10 0441) SpO2:  [84 %-98 %] 92 % (02/10 0912) Weight:  [48.5 kg] 48.5 kg (02/10 0441) Last BM Date: 07/12/18(smear) General:   Stuporous, frail, chronically ill Caucasian female Heart:  Regular rate and rhythm; no murmurs Lungs: Prolonged expiratory phase, poor inspiratory effort Abdomen:  Soft, nontender and nondistended. Normal bowel sounds. Extremities:  Without edema +multiple ecchymoses Neurologic:  Responds to pain, stuporous  Intake/Output from previous day: 02/09 0701 - 02/10 0700 In: 1139.6 [P.O.:30; I.V.:889.6; NG/GT:220] Out: 725 [Urine:225; Stool:500]  Lab Results: Recent Labs    07/11/18 0612 07/12/18 0450 07/13/18 0410  WBC 8.1 6.7 8.8  HGB 9.8* 9.2* 8.9*  HCT 29.9* 28.1* 26.4*  PLT 218 173 175   BMET Recent Labs    07/11/18 0612 07/12/18 0450 07/13/18 0410  NA 139 143 148*  K 3.7 3.2* 3.0*  CL 110 114* 123*  CO2 15* 16* 16*  GLUCOSE 81 92 144*  BUN 38* 40* 38*  CREATININE 2.45* 2.39* 1.88*  CALCIUM 8.2* 8.3* 8.3*   LFT Recent Labs    07/13/18 0410  PROT 5.8*  ALBUMIN 2.4*  AST 211*  ALT 66*  ALKPHOS 136*  BILITOT 2.4*   PT/INR Recent Labs    07/11/18 0002  LABPROT 17.6*  INR 1.46    Studies/Results: Dg Abd 1 View  Result Date: 07/12/2018 CLINICAL DATA:  Nasogastric tube placement EXAM:  ABDOMEN - 1 VIEW COMPARISON:  Portable exam 1834 hours compared to CT abdomen and pelvis of 04/21/2018 FINDINGS: Tip of nasogastric tube projects over stomach though the proximal side-port may be at or just above the gastroesophageal junction, consider advancing tube 3-4 cm. Extensive interstitial infiltrates at lung bases. Bowel gas pattern normal. Bones demineralized. IMPRESSION: Consider advancing nasogastric tube 3-4 cm Electronically Signed   By: Ulyses SouthwardMark  Boles M.D.   On: 07/12/2018 18:58   [place] Renal  Result Date: 07/12/2018 CLINICAL DATA:  Acute renal failure EXAM: RENAL / URINARY TRACT ULTRASOUND COMPLETE COMPARISON:  CT abdomen pelvis 04/21/2018 FINDINGS: Right Kidney: Renal measurements: 10.2 x 4.2 x 4.9 cm = volume: 109.6 mL . Echogenicity within normal limits. No mass or hydronephrosis visualized. Left Kidney: Renal measurements: 10.7 x 4.5 x 4.8 cm = volume: 120.8 mL. Echogenicity within normal limits. No mass or hydronephrosis visualized. Bladder: Appears normal for degree of bladder distention. Increased hepatic parenchymal echogenicity. IMPRESSION: No hydronephrosis. Hepatic steatosis. Electronically Signed   By: Annia Beltrew  Davis M.D.   On: 07/12/2018 14:42   Dg Chest Port 1 View  Result Date: 07/11/2018 CLINICAL DATA:  Cough, shortness of breath, history COPD, CHF EXAM: PORTABLE CHEST 1 VIEW COMPARISON:  Portable exam 1723 hours without priors for comparison Correlation: CT abdomen and pelvis 04/21/2018 FINDINGS: Normal heart size, mediastinal contours, and pulmonary vascularity. Atherosclerotic calcification aorta. Emphysematous changes in the  upper lobes with interstitial infiltrates in the mid to lower lungs bilaterally question pulmonary edema, new. No pleural effusion or pneumothorax. Bones demineralized. IMPRESSION: Emphysematous changes with new interstitial infiltrates in the mid to lower lungs, favor pulmonary edema though infection is not completely excluded. Electronically Signed   By: Ulyses SouthwardMark   Boles M.D.   On: 07/11/2018 18:09    Assessment / Plan:   Assessment: 1.  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  Anemia of chronic disease: Hemoglobin stable  Plan: 1.  Continue NG tube and administration of Lactulose 30 g every 6 hours, also Rifaximin 550 mg twice daily through NG tube 2.  Continue to monitor patient closely 3.  Appreciate nephrology's recommendations 4.  Continue to avoid Benzodiazepines and opiates 5.  Please await any further recommendations from Dr. Adela LankArmbruster later today  Thank you for your kind consultation, we will continue to follow along.   LOS: 0 days   Unk LightningJennifer Lynne Nayda Riesen  07/13/2018, 9:28 AM

## 2018-07-13 NOTE — Progress Notes (Signed)
[place] TEAM 1 - Stepdown/ICU TEAM  Tantania Cargal  JQG:[id number] DOB: 07-24-1950 DOA: 07/29/2018 PCP: Patient, No Pcp Per    Brief Narrative:  68 y.o. female who presents with complaints of decreased oral intake and generalized weakness after being diagnosed with candidal esophagitis for which she was taking Diflucan without improvement of symptoms.  Subjective: The patient is sedated at the time of my visit but does appear comfortable and in no respiratory distress.  Family is at bedside and reports that she has been more alert today.  They feel that she is slowly improving.  Assessment & Plan:  Acute renal failure superimposed on chronic kidney disease stage III  baseline creatinine 1.07 on 04/2018 - creatinine 2.5 on admission -slowly improving -most consistent with prerenal azotemia +/- ATN  Recent Labs  Lab 07/04/2018 1739 07/11/18 0612 07/12/18 0450 07/13/18 0410  CREATININE 2.50* 2.45* 2.39* 1.88*    Liver cirrhosis with hepatic encephalopathy [place] noting hepatic cirrhosis without focal lesion on 06/22/2018 - MELD score around 23 - GI following -continue treatment and follow  Dysphagia, Candidal esophagitis very poor p.o. intake -keep n.p.o. for now until more awake  Generalized weakness Will need PT/OT when more alert  Dyspnea CXR 2/8 noted emphysematous changes with possible edema -no apparent respiratory distress today  Alcohol abuse CIWA protocol   Normocytic normochromic anemia Recheck hemoglobin in a.m. -no gross evidence of blood loss  Chronic diastolic CHF TTE 19/7588 at [facility] demonstrated normal EF with abnormal relaxation pattern  Elevated troponin denies chest pain  COPD Well compensated presently  Hypokalemia Supplement and follow  DVT prophylaxis: SCDs Code Status: FULL CODE  Family Communication: Spoke with multiple family members at bedside Disposition Plan: SDU  Consultants:  Nephrology GI  Antimicrobials:  None  presently  Objective: Blood pressure 98/63, pulse (!) 117, temperature 98.4 F (36.9 C), temperature source Oral, resp. rate 19, height 5' (1.524 m), weight 48.5 kg, SpO2 98 %.  Intake/Output Summary (Last 24 hours) at 07/13/2018 1559 Last data filed at 07/13/2018 1200 Gross per 24 hour  Intake 1283.08 ml  Output 725 ml  Net 558.08 ml   Filed Weights   07/29/2018 2139 07/11/18 0554 07/13/18 0441  Weight: 53.5 kg 52 kg 48.5 kg    Examination: General: No acute respiratory distress Lungs: Clear to auscultation bilaterally without wheezes or crackles Cardiovascular: Regular rate and rhythm without murmur gallop or rub normal S1 and S2 Abdomen: Nondistended, soft, bowel sounds positive, no rebound, no ascites, no appreciable mass Extremities: No significant cyanosis, clubbing, or edema bilateral lower extremities  CBC: Recent Labs  Lab 07/31/2018 1739 07/11/18 0612 07/12/18 0450 07/13/18 0410  WBC 5.5 8.1 6.7 8.8  NEUTROABS 4.4  --  6.0 7.8*  HGB 9.6* 9.8* 9.2* 8.9*  HCT 29.3* 29.9* 28.1* 26.4*  MCV 91.8 92.3 94.3 92.6  PLT 178 218 173 175   Basic Metabolic Panel: Recent Labs  Lab 07/08/2018 1749 07/11/18 0612 07/12/18 0450 07/13/18 0410  NA  --  139 143 148*  K  --  3.7 3.2* 3.0*  CL  --  110 114* 123*  CO2  --  15* 16* 16*  GLUCOSE  --  81 92 144*  BUN  --  38* 40* 38*  CREATININE  --  2.45* 2.39* 1.88*  CALCIUM  --  8.2* 8.3* 8.3*  MG 2.2  --   --  1.8   GFR: Estimated Creatinine Clearance: 21.1 mL/min (A) (by C-G formula based on SCr of 1.88  mg/dL (H)).  Liver Function Tests: Recent Labs  Lab 12-Jul-2018 1739 07/13/18 0410  AST 110* 211*  ALT 43 66*  ALKPHOS 145* 136*  BILITOT 2.2* 2.4*  PROT 6.5 5.8*  ALBUMIN 2.9* 2.4*    Recent Labs  Lab 2018/07/12 1739 07/11/18 1247 07/12/18 0450 07/13/18 0410  AMMONIA 85* 66* 83* 184*    Coagulation Profile: Recent Labs  Lab 07/11/18 0002  INR 1.46    Cardiac Enzymes: Recent Labs  Lab 12-Jul-2018 1739  07/11/18 0002 07/11/18 0612 07/11/18 1247  TROPONINI 0.04* 0.03* 0.04* 0.04*    HbA1C: Hgb A1c MFr Bld  Date/Time Value Ref Range Status  07/11/2018 12:04 AM 5.2 4.8 - 5.6 % Final    Comment:    (NOTE) Pre diabetes:          5.7%-6.4% Diabetes:              >6.4% Glycemic control for   <7.0% adults with diabetes     Scheduled Meds: . cholecalciferol  5,000 Units Oral Daily  . folic acid  1 mg Oral Daily  . lactulose  30 g Oral Q6H  . metoprolol tartrate  25 mg Oral Daily  . mometasone-formoterol  2 puff Inhalation BID  . multivitamin with minerals  1 tablet Oral Daily  . nystatin  5 mL Oral QID  . pantoprazole  40 mg Oral Daily  . potassium chloride  40 mEq Oral Daily  . rifaximin  550 mg Per Tube BID  . thiamine  100 mg Oral Daily   Or  . thiamine  100 mg Intravenous Daily  . umeclidinium bromide  1 puff Inhalation Daily     LOS: 0 days   Lonia Blood, MD [facility] Office  [phone] Pager - Text Page per Amion  If 7PM-7AM, please contact night-coverage per Amion 07/13/2018, 3:59 PM

## 2018-07-14 ENCOUNTER — Inpatient Hospital Stay (HOSPITAL_COMMUNITY): Payer: Managed Care, Other (non HMO)

## 2018-07-14 DIAGNOSIS — Z4659 Encounter for fitting and adjustment of other gastrointestinal appliance and device: Secondary | ICD-10-CM

## 2018-07-14 DIAGNOSIS — J9621 Acute and chronic respiratory failure with hypoxia: Secondary | ICD-10-CM

## 2018-07-14 DIAGNOSIS — E870 Hyperosmolality and hypernatremia: Secondary | ICD-10-CM

## 2018-07-14 DIAGNOSIS — R338 Other retention of urine: Secondary | ICD-10-CM

## 2018-07-14 DIAGNOSIS — J438 Other emphysema: Secondary | ICD-10-CM

## 2018-07-14 LAB — IRON AND TIBC
Iron: 53 ug/dL (ref 28–170)
Saturation Ratios: 31 % (ref 10.4–31.8)
TIBC: 172 ug/dL — ABNORMAL LOW (ref 250–450)
UIBC: 119 ug/dL

## 2018-07-14 LAB — CBC
HCT: 29.7 % — ABNORMAL LOW (ref 36.0–46.0)
Hemoglobin: 9.9 g/dL — ABNORMAL LOW (ref 12.0–15.0)
MCH: 30.3 pg (ref 26.0–34.0)
MCHC: 33.3 g/dL (ref 30.0–36.0)
MCV: 90.8 fL (ref 80.0–100.0)
Platelets: 156 10*3/uL (ref 150–400)
RBC: 3.27 MIL/uL — ABNORMAL LOW (ref 3.87–5.11)
RDW: 18.8 % — AB (ref 11.5–15.5)
WBC: 9.7 10*3/uL (ref 4.0–10.5)
nRBC: 0 % (ref 0.0–0.2)

## 2018-07-14 LAB — RETICULOCYTES
Immature Retic Fract: 15.3 % (ref 2.3–15.9)
RBC.: 3.27 MIL/uL — AB (ref 3.87–5.11)
Retic Count, Absolute: 70 10*3/uL (ref 19.0–186.0)
Retic Ct Pct: 2.1 % (ref 0.4–3.1)

## 2018-07-14 LAB — COMPREHENSIVE METABOLIC PANEL
ALT: 71 U/L — AB (ref 0–44)
AST: 198 U/L — ABNORMAL HIGH (ref 15–41)
Albumin: 2.5 g/dL — ABNORMAL LOW (ref 3.5–5.0)
Alkaline Phosphatase: 152 U/L — ABNORMAL HIGH (ref 38–126)
Anion gap: 10 (ref 5–15)
BUN: 34 mg/dL — ABNORMAL HIGH (ref 8–23)
CO2: 18 mmol/L — ABNORMAL LOW (ref 22–32)
Calcium: 8.9 mg/dL (ref 8.9–10.3)
Chloride: 125 mmol/L — ABNORMAL HIGH (ref 98–111)
Creatinine, Ser: 1.24 mg/dL — ABNORMAL HIGH (ref 0.44–1.00)
GFR calc Af Amer: 52 mL/min — ABNORMAL LOW (ref >60)
GFR calc non Af Amer: 45 mL/min — ABNORMAL LOW (ref >60)
Glucose, Bld: 122 mg/dL — ABNORMAL HIGH (ref 70–99)
Potassium: 3.5 mmol/L (ref 3.5–5.1)
Sodium: 153 mmol/L — ABNORMAL HIGH (ref 135–145)
Total Bilirubin: 2.4 mg/dL — ABNORMAL HIGH (ref 0.3–1.2)
Total Protein: 6.1 g/dL — ABNORMAL LOW (ref 6.5–8.1)

## 2018-07-14 LAB — VITAMIN B12: Vitamin B-12: 2054 pg/mL — ABNORMAL HIGH (ref 180–914)

## 2018-07-14 LAB — FOLATE: Folate: 10.4 ng/mL (ref >5.9)

## 2018-07-14 LAB — FERRITIN: Ferritin: 215 ng/mL (ref 11–307)

## 2018-07-14 LAB — MAGNESIUM: Magnesium: 1.8 mg/dL (ref 1.7–2.4)

## 2018-07-14 MED ORDER — FREE WATER
250.0000 mL | Freq: Four times a day (QID) | Status: DC
  Administered 2018-07-14 – 2018-07-17 (×13): 250 mL

## 2018-07-14 MED ORDER — IPRATROPIUM BROMIDE 0.02 % IN SOLN: 0.5000 mg | Freq: Three times a day (TID) | RESPIRATORY_TRACT | Status: DC

## 2018-07-14 MED ORDER — LEVALBUTEROL HCL 1.25 MG/0.5ML IN NEBU
1.2500 mg | INHALATION_SOLUTION | Freq: Three times a day (TID) | RESPIRATORY_TRACT | Status: DC
  Administered 2018-07-14: 1.25 mg via RESPIRATORY_TRACT
  Filled 2018-07-14 (×2): qty 0.5

## 2018-07-14 NOTE — Progress Notes (Signed)
Upon entering room pt had pulled out her NG tube despite family member being in the room. Clovis Fredrickson RN made aware. Dr. Roda Shutters paged.   Moshe Salisbury, RN

## 2018-07-14 NOTE — Progress Notes (Addendum)
Notified MD bladder scanned patient per orders, patient was retaining 500 cc, received orders for an in and out urinary catheter from Dr. Roda Shutters. Peri-care provided per and post catheter insertion. Order completed and 500 cc of urine came out of the patient.

## 2018-07-14 NOTE — Progress Notes (Signed)
Initial Nutrition Assessment  Late Entry for 08/04/2018  DOCUMENTATION CODES:   Not applicable  INTERVENTION:   -RD will follow for diet advancement and supplement as appropriate -If pt remains unable to take PO's, consider initiation of nutrition support. Recommend:  Initiate Osmolite 1.2 @ 20 ml/hr via NGT and increase by 10 ml every 4 hours to goal rate of 60 ml/hr.   30 ml Prostat daily.    Tube feeding regimen provides 1828 kcal (100% of needs), 95 grams of protein, and 1181 ml of H2O.   NUTRITION DIAGNOSIS:   Inadequate oral intake related to lethargy/confusion as evidenced by NPO status.  GOAL:   Patient will meet greater than or equal to 90% of their needs  MONITOR:   Diet advancement, Labs, Weight trends, Skin, I & O's  REASON FOR ASSESSMENT:   Low Braden    ASSESSMENT:   Alyssa Mckenzie is a 68 y.o. female with medical history significant of dCHF, COPD, GERD, liver cirrhosis, CKD 3, alcohol use, anemia, who presents with generalized weakness and fatigue.  Pt admitted with ARF on CKD 3.  2/9- transferred to PCU, NGT placed   Pt very lethargic at time of visit. No family present to provide additional history.   Pt currently NPO. NGT was placed for lactulose. Pt also receiving 200 ml free water flush every 6 hours.   Reviewed wt hx; noted pt has experienced a 7.9% wt loss over the past 3 months, which is significant for time frame.   Unable to identify malnutrition at this time, however, pt is at high risk given NPO status, poor mentation, and history of ETOH abuse.   Labs reviewed: Na: 153 (on IV supplementation).   NUTRITION - FOCUSED PHYSICAL EXAM:    Most Recent Value  Orbital Region  No depletion  Upper Arm Region  Mild depletion  Thoracic and Lumbar Region  No depletion  Buccal Region  No depletion  Temple Region  No depletion  Clavicle Bone Region  No depletion  Clavicle and Acromion Bone Region  No depletion  Scapular Bone Region  No  depletion  Dorsal Hand  Mild depletion  Patellar Region  Mild depletion  Anterior Thigh Region  Mild depletion  Posterior Calf Region  Mild depletion  Edema (RD Assessment)  None  Hair  Reviewed  Eyes  Reviewed  Mouth  Reviewed  Skin  Reviewed  Nails  Reviewed       Diet Order:   Diet Order            Diet NPO time specified  Diet effective now              EDUCATION NEEDS:   Not appropriate for education at this time  Skin:  Skin Assessment: Reviewed RN Assessment  Last BM:  Aug 04, 2018 (70 ml via rectal tube)  Height:   Ht Readings from Last 1 Encounters:  07/22/2018 5' (1.524 m)    Weight:   Wt Readings from Last 1 Encounters:  07/15/18 55.9 kg    Ideal Body Weight:  45.5 kg  BMI:  Body mass index is 24.07 kg/m.  Estimated Nutritional Needs:   Kcal:  1750-1950  Protein:  85-100 grams  Fluid:  >1.7 L    Alyssa Mckenzie A. Mayford Knife, RD, LDN, CDE Pager: [phone] After hours Pager: [phone]

## 2018-07-14 NOTE — Progress Notes (Signed)
PROGRESS NOTE  Alyssa Mckenzie FPO:[id number] DOB: 08-19-1950 DOA: 07/22/2018 PCP: Patient, No Pcp Per  Brief Summary  68 y.o.female with h/o COPD on 2liter 02 at home, h/o alcohol cirrhosis, diastolic chfwho presents with complaints of decreased oral intake and generalized weakness after being diagnosed with candidal esophagitis for which she was taking Diflucan without improvement of symptoms. She is found to have AKI and hepatic encephalopathy GI/ nephrology following   HPI/Recap of past 24 hours:  Drowsy, drift back to sleep during conversation, + NG, + rectal tube  RN report she has to pump up o2 to 6liter this am due to hypoxia  RN reports difficulty measuring urine output  Assessment/Plan: Principal Problem:   Acute renal failure superimposed on stage 3 chronic kidney disease (HCC) Active Problems:   COPD (chronic obstructive pulmonary disease) (HCC)   Liver cirrhosis (HCC)   Dehydration   Hypokalemia   Elevated troponin   Alcohol use   Esophagitis   Normocytic anemia   Chronic diastolic CHF (congestive heart failure) (HCC)   AKI (acute kidney injury) (HCC)   Encephalopathy, hepatic (HCC)  Acute on chronic hypoxia:  -increase oxygen requirement on 2/11 - stat cxr "Worsening bibasilar infiltrates or edema, left greater than right." -no fever, no leukocytosis, likely fluids overload, d/c ivf,  -exam with mild wheezing, add nebs ( xopenex/atrovent) -monitor urine output, o2 , consider abx if spike fever to cover aspiration , consider PE study if persistent hypoxia ( she has been on SCD since in the hospital)  Hypernatremia: Sodium 153 on 2/11, d/c ivf due to hypoxia Getting Free water through NG  AKI on CKDIII (presenting symptom) -cr 2.5 on presentation, cr 1.24 today -nephrology following,  -renal dosing meds,   Urinary retention: 500cc urine on bladder scan on 2/11 required In and out cathx1,  bladder scan qshift, insert foley if persistent  retension  Decompensated cirrhosis with hepatic encephalopathy -not awake enough to start oral feeding -continue ng for nutrition and meds -on rifaximin, lactulose -will follow GI recommendation, GI input appreciated  -consider CT head if remain drowsy  Dysphagia, Candidal esophagitis -treated with diflucan PTA,  -will need swallow eval once able to take oral  -currently npo , nutrition through NG.  COPD on home o2 2liter -will increase o2 requirement /mild wheezing on 2/11, likely due to fluids overload -on nebs, monitoring  HTN;  bp stable on current regiment  H/o Diastolic CHF: -presented with arf, now cr improving, with signs of volume overload -likely will need to restart lasix soon, ivf stopped on 2/11.  Normocytic Anemia  Likely anemia of chronic diease  -monitor  Alcohol use: 4glasses of wine per day On folic acid/thiamine supplement Not sure if alcohol withdrawal contribute to encephalopathy   FTT: will likely need SNF placement once medically stable  Code Status: full  Family Communication: patient   Disposition Plan: not ready to discharge   Consultants:  GI  nephrology  Procedures:  NG placement  Antibiotics:  rifaximin   Objective: BP 136/73   Pulse (!) 105   Temp 98 F (36.7 C) (Oral)   Resp 20   Ht 5' (1.524 m)   Wt 58.9 kg   SpO2 97%   BMI 25.36 kg/m   Intake/Output Summary (Last 24 hours) at 07/14/2018 1705 Last data filed at 07/14/2018 1520 Gross per 24 hour  Intake 800 ml  Output 2050 ml  Net -1250 ml   Filed Weights   07/11/18 0554 07/13/18 0441 07/14/18 0312  Weight: 52 kg 48.5 kg 58.9 kg    Exam: Patient is examined daily including today on 07/14/2018, exams remain the same as of yesterday except that has changed    General:  Very drowsy, drift back to sleep during conversation, NG in place, on 6l o2 supplement, rectal tube with loose stool  Cardiovascular: sinus tahcycardia  Respiratory: diminished, mild  bilateral wheezing  Abdomen: Soft/ND/NT, positive BS  Musculoskeletal: No Edema  Neuro: drowsy , does not follow commands  Data Reviewed: Basic Metabolic Panel: Recent Labs  Lab 02-23-2019 1739 02-23-2019 1749 07/11/18 0612 07/12/18 0450 07/13/18 0410 07/14/18 0337  NA 133*  --  139 143 148* 153*  K 2.9*  --  3.7 3.2* 3.0* 3.5  CL 101  --  110 114* 123* 125*  CO2 18*  --  15* 16* 16* 18*  GLUCOSE 105*  --  81 92 144* 122*  BUN 46*  --  38* 40* 38* 34*  CREATININE 2.50*  --  2.45* 2.39* 1.88* 1.24*  CALCIUM 8.8*  --  8.2* 8.3* 8.3* 8.9  MG  --  2.2  --   --  1.8 1.8   Liver Function Tests: Recent Labs  Lab 02-23-2019 1739 07/13/18 0410 07/14/18 0337  AST 110* 211* 198*  ALT 43 66* 71*  ALKPHOS 145* 136* 152*  BILITOT 2.2* 2.4* 2.4*  PROT 6.5 5.8* 6.1*  ALBUMIN 2.9* 2.4* 2.5*   No results for input(s): LIPASE, AMYLASE in the last 168 hours. Recent Labs  Lab 02-23-2019 1739 07/11/18 1247 07/12/18 0450 07/13/18 0410  AMMONIA 85* 66* 83* 184*   CBC: Recent Labs  Lab 02-23-2019 1739 07/11/18 0612 07/12/18 0450 07/13/18 0410 07/14/18 0337  WBC 5.5 8.1 6.7 8.8 9.7  NEUTROABS 4.4  --  6.0 7.8*  --   HGB 9.6* 9.8* 9.2* 8.9* 9.9*  HCT 29.3* 29.9* 28.1* 26.4* 29.7*  MCV 91.8 92.3 94.3 92.6 90.8  PLT 178 218 173 175 156   Cardiac Enzymes:   Recent Labs  Lab 02-23-2019 1739 07/11/18 0002 07/11/18 0612 07/11/18 1247  TROPONINI 0.04* 0.03* 0.04* 0.04*   BNP (last 3 results) Recent Labs    07/11/18 0002  BNP 480.1*    ProBNP (last 3 results) No results for input(s): PROBNP in the last 8760 hours.  CBG: No results for input(s): GLUCAP in the last 168 hours.  No results found for this or any previous visit (from the past 240 hour(s)).   Studies: Dg Chest Port 1 View  Result Date: 07/14/2018 CLINICAL DATA:  hypoxia EXAM: PORTABLE CHEST - 1 VIEW COMPARISON:  07/11/2018 FINDINGS: Worsening bibasilar edema or infiltrates left greater than right. Attenuated  bronchovascular markings in the right upper lung. Heart size and mediastinal contours are within normal limits. No definite effusion. No pneumothorax. Gastric tube extends to the stomach. Visualized bones unremarkable. IMPRESSION: Worsening bibasilar infiltrates or edema, left greater than right. Electronically Signed   By: Corlis Leak  Hassell M.D.   On: 07/14/2018 14:47    Scheduled Meds: . folic acid  1 mg Per Tube Daily  . free water  250 mL Per Tube Q6H  . lactulose  30 g Per Tube Q6H  . levalbuterol  1.25 mg Nebulization Q8H  . metoprolol tartrate  12.5 mg Per Tube Daily  . mometasone-formoterol  2 puff Inhalation BID  . nystatin  5 mL Oral QID  . pantoprazole sodium  40 mg Per Tube Daily  . potassium chloride  40 mEq Per Tube BID  .  rifaximin  550 mg Per Tube BID  . thiamine  100 mg Per Tube Daily  . umeclidinium bromide  1 puff Inhalation Daily    Continuous Infusions: . sodium chloride 10 mL/hr at 07/12/18 1945     Time spent: 35mins I have personally reviewed and interpreted on  07/14/2018 daily labs, tele strips, imagings as discussed above under date review session and assessment and plans.  I reviewed all nursing notes, pharmacy notes, consultant notes,  vitals, pertinent old records  I have discussed plan of care as described above with RN , patient  on 07/14/2018   Albertine GratesFang Felita Bump MD, PhD  [facility] Pager [phone]. If 7PM-7AM, please contact night-coverage at [email or web address], password [email or web address] 07/14/2018, 5:05 PM  LOS: 1 day

## 2018-07-14 NOTE — Progress Notes (Signed)
Physical Therapy Treatment Patient Details Name: Alyssa OdeaCynthia Duncombe MRN: [id number] DOB: [birth year] Today's Date: 07/14/2018    History of Present Illness 68 y.o. female with medical history significant of dCHF, COPD, GERD, liver cirrhosis, CKD 3, alcohol use, anemia, who presents with generalized weakness and fatigue.  Pt admitted for acute renal failure and dehydration. Elevated ammonia on admission.    PT Comments    Patient sleeping but easily aroused. Agreeable to sit EOB. Requires Mod A for all supine <> sit mobility with total A for bed repositioning. In sitting able to progress sitting balance to Min guard at times, however requires consistent cueing for posturing and forward gaze with limited carryover. Unable to progress OOB mobility due to lethargy/fatigue. Will continue to follow.     Follow Up Recommendations  SNF;Supervision/Assistance - 24 hour     Equipment Recommendations  Other (comment)(TBD)    Recommendations for Other Services       Precautions / Restrictions Precautions Precautions: Fall Restrictions Weight Bearing Restrictions: No    Mobility  Bed Mobility Overal bed mobility: Needs Assistance Bed Mobility: Supine to Sit;Sit to Supine     Supine to sit: Mod assist;HOB elevated Sit to supine: Mod assist;HOB elevated   General bed mobility comments: cueing for sequencing and posturing with limited carryover; eyes reamin closed throughout session; total A +2 for repositioning in bed  Transfers                 General transfer comment: unable  Ambulation/Gait                 Stairs             Wheelchair Mobility    Modified Rankin (Stroke Patients Only)       Balance Overall balance assessment: Needs assistance Sitting-balance support: Bilateral upper extremity supported;Feet supported Sitting balance-Leahy Scale: Fair Sitting balance - Comments: Min guard to Min A for upright sitting balance                                    Cognition Arousal/Alertness: Lethargic Behavior During Therapy: Flat affect Overall Cognitive Status: No family/caregiver present to determine baseline cognitive functioning                                 General Comments: patient only answering "yes" and "no" questions; repeats "I need my inhaler" - nursing notified      Exercises      General Comments        Pertinent Vitals/Pain Pain Assessment: No/denies pain    Home Living                      Prior Function            PT Goals (current goals can now be found in the care plan section) Acute Rehab PT Goals Patient Stated Goal: not stated PT Goal Formulation: Patient unable to participate in goal setting Time For Goal Achievement: 07/26/18 Potential to Achieve Goals: Fair Progress towards PT goals: Progressing toward goals    Frequency    Min 3X/week      PT Plan Current plan remains appropriate    Co-evaluation              AM-PAC PT "6 Clicks" Mobility   Outcome Measure  Help needed turning  from your back to your side while in a flat bed without using bedrails?: A Lot Help needed moving from lying on your back to sitting on the side of a flat bed without using bedrails?: A Lot Help needed moving to and from a bed to a chair (including a wheelchair)?: A Lot Help needed standing up from a chair using your arms (e.g., wheelchair or bedside chair)?: A Lot Help needed to walk in hospital room?: Total Help needed climbing 3-5 steps with a railing? : Total 6 Click Score: 10    End of Session Equipment Utilized During Treatment: Oxygen Activity Tolerance: Patient limited by lethargy Patient left: in bed;with call bell/phone within reach;with bed alarm set Nurse Communication: Mobility status PT Visit Diagnosis: Other abnormalities of gait and mobility (R26.89);Muscle weakness (generalized) (M62.81)     Time: 1050-1102 PT Time Calculation (min) (ACUTE  ONLY): 12 min  Charges:  $Therapeutic Activity: 8-22 mins                      Kipp Laurence, PT, DPT Supplemental Physical Therapist 07/14/18 11:52 AM Pager: [phone] Office: [phone]

## 2018-07-14 NOTE — Progress Notes (Signed)
Progress Note   Subjective  Chief Complaint: Decompensated cirrhosis with hepatic encephalopathy  This morning patient is some better.  She is able to tell me the month and who she is and where she is.  Tells me she is not having any abdominal pain.  Is still lethargic.  Per nursing no new complaints.   Objective   Vital signs in last 24 hours: Temp:  [96.4 F (35.8 C)-98.4 F (36.9 C)] 98 F (36.7 C) (02/11 0932) Pulse Rate:  [75-117] 75 (02/11 0932) Resp:  [13-24] 19 (02/11 0932) BP: (98-141)/(63-82) 136/73 (02/11 0932) SpO2:  [93 %-98 %] 94 % (02/11 0932) Weight:  [58.9 kg] 58.9 kg (02/11 0642) Last BM Date: 07/14/18 General:   Frail, chronically ill, white female in NAD Heart:  Regular rate and rhythm; no murmurs Lungs: Respirations even and unlabored, lungs CTA bilaterally Abdomen:  Soft, nontender and nondistended. Normal bowel sounds. Extremities:  Without edema.+multiple echymosis. Psych:  More responsive today  Intake/Output from previous day: 02/10 0701 - 02/11 0700 In: 1180 [P.O.:480; I.V.:400; NG/GT:300] Out: 850 [Stool:850] Intake/Output this shift: Total I/O In: 100 [NG/GT:100] Out: 0   Lab Results: Recent Labs    07/12/18 0450 07/13/18 0410 07/14/18 0337  WBC 6.7 8.8 9.7  HGB 9.2* 8.9* 9.9*  HCT 28.1* 26.4* 29.7*  PLT 173 175 156   BMET Recent Labs    07/12/18 0450 07/13/18 0410 07/14/18 0337  NA 143 148* 153*  K 3.2* 3.0* 3.5  CL 114* 123* 125*  CO2 16* 16* 18*  GLUCOSE 92 144* 122*  BUN 40* 38* 34*  CREATININE 2.39* 1.88* 1.24*  CALCIUM 8.3* 8.3* 8.9   LFT Recent Labs    07/14/18 0337  PROT 6.1*  ALBUMIN 2.5*  AST 198*  ALT 71*  ALKPHOS 152*  BILITOT 2.4*   Studies/Results: Dg Abd 1 View  Result Date: 07/12/2018 CLINICAL DATA:  Nasogastric tube placement EXAM: ABDOMEN - 1 VIEW COMPARISON:  Portable exam 1834 hours compared to CT abdomen and pelvis of 04/21/2018 FINDINGS: Tip of nasogastric tube projects over stomach  though the proximal side-port may be at or just above the gastroesophageal junction, consider advancing tube 3-4 cm. Extensive interstitial infiltrates at lung bases. Bowel gas pattern normal. Bones demineralized. IMPRESSION: Consider advancing nasogastric tube 3-4 cm Electronically Signed   By: Ulyses SouthwardMark  Boles M.D.   On: 07/12/2018 18:58   [place] Renal  Result Date: 07/12/2018 CLINICAL DATA:  Acute renal failure EXAM: RENAL / URINARY TRACT ULTRASOUND COMPLETE COMPARISON:  CT abdomen pelvis 04/21/2018 FINDINGS: Right Kidney: Renal measurements: 10.2 x 4.2 x 4.9 cm = volume: 109.6 mL . Echogenicity within normal limits. No mass or hydronephrosis visualized. Left Kidney: Renal measurements: 10.7 x 4.5 x 4.8 cm = volume: 120.8 mL. Echogenicity within normal limits. No mass or hydronephrosis visualized. Bladder: Appears normal for degree of bladder distention. Increased hepatic parenchymal echogenicity. IMPRESSION: No hydronephrosis. Hepatic steatosis. Electronically Signed   By: Annia Beltrew  Davis M.D.   On: 07/12/2018 14:42    Assessment / Plan:   Assessment: 1.  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  Anemia of chronic disease  Plan: 1.  Continue NG tube administration of Lactulose 30 g every 6 hours as well as Rifaximin 550 mg twice daily through NG tube.  Again keep head of bed elevated while giving Lactulose 2.  Continue to monitor patient closely  3.  Continue n.p.o. for now given mental status 4.  Please await further recommendations from Dr. Adela Lank later today  Thank for kind consultation, we will continue to follow.    LOS: 1 day   Unk Lightning  07/14/2018, 11:50 AM

## 2018-07-14 NOTE — Plan of Care (Signed)

## 2018-07-14 NOTE — Progress Notes (Signed)
Spoke with Dr. Roda Shutters regarding NG tube which pt pulled out. Per conversation NG needs replaced to give free water flushes given pt's low sodium and encephalopathy preventing PO intake. Order placed for NG.   Leonidas Romberg, RN

## 2018-07-14 NOTE — Progress Notes (Signed)
[facility]    NEPHROLOGY PROGRESS NOTE  SUBJECTIVE: Resting comfortably without complaints except wants to go home.  Sister at bedside says mental status improving but still not to BL.  No acute events.     OBJECTIVE:  Vitals:   07/14/18 0842 07/14/18 0932  BP:  136/73  Pulse:  75  Resp:  19  Temp:  98 F (36.7 C)  SpO2: 96% 94%    Intake/Output Summary (Last 24 hours) at 07/14/2018 1205 Last data filed at 07/14/2018 0932 Gross per 24 hour  Intake 600 ml  Output 850 ml  Net -250 ml      Genearl: Resting NAD HEENT: MMM [place] AT anicteric sclera Neck:  No JVD, no adenopathy CV: Heart regular rate and rhythm Lungs: Equal chest excursion, normal respiratory effort Abd:  abd SNT/ND with normal BS, no frank ascites GU:  Bladder non-palpable Extremities:  No LE edema. Skin:  No skin rash  MEDICATIONS:  . folic acid  1 mg Per Tube Daily  . free water  200 mL Per Tube Q6H  . lactulose  30 g Per Tube Q6H  . metoprolol tartrate  12.5 mg Per Tube Daily  . mometasone-formoterol  2 puff Inhalation BID  . nystatin  5 mL Oral QID  . pantoprazole sodium  40 mg Per Tube Daily  . potassium chloride  40 mEq Per Tube BID  . rifaximin  550 mg Per Tube BID  . thiamine  100 mg Per Tube Daily  . umeclidinium bromide  1 puff Inhalation Daily       LABS:   CBC Latest Ref Rng & Units 07/14/2018 07/13/2018 07/12/2018  WBC 4.0 - 10.5 K/uL 9.7 8.8 6.7  Hemoglobin 12.0 - 15.0 g/dL 2.8(J) 8.9(L) 9.2(L)  Hematocrit 36.0 - 46.0 % 29.7(L) 26.4(L) 28.1(L)  Platelets 150 - 400 K/uL 156 175 173    CMP Latest Ref Rng & Units 07/14/2018 07/13/2018 07/12/2018  Glucose 70 - 99 mg/dL 681(L) 572(I) 92  BUN 8 - 23 mg/dL 20(B) 55(H) 74(B)  Creatinine 0.44 - 1.00 mg/dL 6.38(G) 5.36(I) 6.80(H)  Sodium 135 - 145 mmol/L 153(H) 148(H) 143  Potassium 3.5 - 5.1 mmol/L 3.5 3.0(L) 3.2(L)  Chloride 98 - 111 mmol/L 125(H) 123(H) 114(H)  CO2 22 - 32 mmol/L 18(L) 16(L) 16(L)  Calcium 8.9 - 10.3  mg/dL 8.9 2.1(Y) 8.3(L)  Total Protein 6.5 - 8.1 g/dL 6.1(L) 5.8(L) -  Total Bilirubin 0.3 - 1.2 mg/dL 2.4(H) 2.4(H) -  Alkaline Phos 38 - 126 U/L 152(H) 136(H) -  AST 15 - 41 U/L 198(H) 211(H) -  ALT 0 - 44 U/L 71(H) 66(H) -    Lab Results  Component Value Date   CALCIUM 8.9 07/14/2018       Component Value Date/Time   COLORURINE YELLOW 07/06/2018 1739   APPEARANCEUR CLOUDY (A) 07/09/2018 1739   LABSPEC 1.010 07/13/2018 1739   PHURINE 6.0 07/16/2018 1739   GLUCOSEU NEGATIVE 07/31/2018 1739   HGBUR NEGATIVE 07/08/2018 1739   BILIRUBINUR NEGATIVE 07/17/2018 1739   KETONESUR NEGATIVE 07/14/2018 1739   PROTEINUR NEGATIVE 07/24/2018 1739   NITRITE NEGATIVE 07/21/2018 1739   LEUKOCYTESUR SMALL (A) 07/26/2018 1739   No results found for: PHART, PCO2ART, PO2ART, HCO3, TCO2, ACIDBASEDEF, O2SAT     Component Value Date/Time   IRON 53 07/14/2018 0337   TIBC 172 (L) 07/14/2018 0337   FERRITIN 215 07/14/2018 0337   IRONPCTSAT 31 07/14/2018 0337       ASSESSMENT/PLAN:  1.  Chronic kidney disease stage III with a baseline serum creatinine of 1.07 from 04/2018.  2.  Acute kidney injury.  Likely on the basis of prolonged volume depletion with a component of ATN.  Urine was negative for blood or protein, arguing against any intrinsic renal disease.  Urine sodium was less than 17, consistent with a prerenal azotemia.  She did respond to IV fluids.  Her slow improvement is likely secondary to a component of ATN.  Creatinine improved to 1.24 today.   3.  Cirrhosis.  Continue lactulose.  With improving creatinine, no clear evidence of type I hepatorenal syndrome.  No significant ascites on examination.  4.  Anemia.  No evidence of occult bleeding.  Is normocytic.  5.  History of chronic diastolic heart failure.  Chest x-ray with some minimal increase in edema with IV fluids.  Is oxygenating well.  6.  Mixed gap and non-anion gap metabolic acidosis.  Likely secondary to acute  kidney injury.  Renal function is improving.    7. Hypernatremia:  Large volume stool reported.  NPO for mental status.   Have increased FWF from 200 to 250mL QID via tube for now.  Also on D51/2NS at 50/hr.  Hopefully mental status will improve that none for these will be needed soon.    Given marked improvement in renal function and very close to baseline will sign off.  Please call if we can be of further assistance.    Estill BakesLindsay Kruska MD

## 2018-07-15 ENCOUNTER — Inpatient Hospital Stay (HOSPITAL_COMMUNITY): Payer: Managed Care, Other (non HMO) | Admitting: Critical Care Medicine

## 2018-07-15 ENCOUNTER — Inpatient Hospital Stay (HOSPITAL_COMMUNITY): Payer: Managed Care, Other (non HMO)

## 2018-07-15 ENCOUNTER — Other Ambulatory Visit (HOSPITAL_COMMUNITY): Payer: Managed Care, Other (non HMO)

## 2018-07-15 DIAGNOSIS — R0609 Other forms of dyspnea: Secondary | ICD-10-CM

## 2018-07-15 DIAGNOSIS — I471 Supraventricular tachycardia: Secondary | ICD-10-CM

## 2018-07-15 DIAGNOSIS — I272 Pulmonary hypertension, unspecified: Secondary | ICD-10-CM

## 2018-07-15 DIAGNOSIS — K7290 Hepatic failure, unspecified without coma: Secondary | ICD-10-CM

## 2018-07-15 DIAGNOSIS — J9601 Acute respiratory failure with hypoxia: Secondary | ICD-10-CM

## 2018-07-15 DIAGNOSIS — I519 Heart disease, unspecified: Secondary | ICD-10-CM

## 2018-07-15 DIAGNOSIS — I371 Nonrheumatic pulmonary valve insufficiency: Secondary | ICD-10-CM

## 2018-07-15 LAB — BLOOD GAS, ARTERIAL
Acid-base deficit: 9.8 mmol/L — ABNORMAL HIGH (ref 0.0–2.0)
Bicarbonate: 15 mmol/L — ABNORMAL LOW (ref 20.0–28.0)
Drawn by: 511331
O2 CONTENT: 8 L/min
O2 Saturation: 88.1 %
PATIENT TEMPERATURE: 98.9
PCO2 ART: 30 mmHg — AB (ref 32.0–48.0)
PO2 ART: 62.2 mmHg — AB (ref 83.0–108.0)
pH, Arterial: 7.322 — ABNORMAL LOW (ref 7.350–7.450)

## 2018-07-15 LAB — AMMONIA: Ammonia: 79 umol/L — ABNORMAL HIGH (ref 9–35)

## 2018-07-15 LAB — POCT I-STAT 7, (LYTES, BLD GAS, ICA,H+H)
Acid-base deficit: 8 mmol/L — ABNORMAL HIGH (ref 0.0–2.0)
Bicarbonate: 20 mmol/L (ref 20.0–28.0)
Calcium, Ion: 1.3 mmol/L (ref 1.15–1.40)
HCT: 25 % — ABNORMAL LOW (ref 36.0–46.0)
Hemoglobin: 8.5 g/dL — ABNORMAL LOW (ref 12.0–15.0)
O2 Saturation: 100 %
PCO2 ART: 49.5 mmHg — AB (ref 32.0–48.0)
PO2 ART: 250 mmHg — AB (ref 83.0–108.0)
Patient temperature: 98.1
Potassium: 4.6 mmol/L (ref 3.5–5.1)
Sodium: 158 mmol/L — ABNORMAL HIGH (ref 135–145)
TCO2: 21 mmol/L — ABNORMAL LOW (ref 22–32)
pH, Arterial: 7.212 — ABNORMAL LOW (ref 7.350–7.450)

## 2018-07-15 LAB — GLUCOSE, CAPILLARY
Glucose-Capillary: 122 mg/dL — ABNORMAL HIGH (ref 70–99)
Glucose-Capillary: 113 mg/dL — ABNORMAL HIGH (ref 70–99)

## 2018-07-15 LAB — URINALYSIS, ROUTINE W REFLEX MICROSCOPIC
BILIRUBIN URINE: NEGATIVE
Glucose, UA: NEGATIVE mg/dL
Ketones, ur: NEGATIVE mg/dL
Leukocytes,Ua: NEGATIVE
Nitrite: POSITIVE — AB
Protein, ur: NEGATIVE mg/dL
Specific Gravity, Urine: 1.017 (ref 1.005–1.030)
pH: 6 (ref 5.0–8.0)

## 2018-07-15 LAB — CBC
HCT: 32.1 % — ABNORMAL LOW (ref 36.0–46.0)
Hemoglobin: 10.1 g/dL — ABNORMAL LOW (ref 12.0–15.0)
MCH: 30 pg (ref 26.0–34.0)
MCHC: 31.5 g/dL (ref 30.0–36.0)
MCV: 95.3 fL (ref 80.0–100.0)
Platelets: 191 10*3/uL (ref 150–400)
RBC: 3.37 MIL/uL — AB (ref 3.87–5.11)
RDW: 20.4 % — ABNORMAL HIGH (ref 11.5–15.5)
WBC: 12.9 10*3/uL — ABNORMAL HIGH (ref 4.0–10.5)
nRBC: 0.2 % (ref 0.0–0.2)

## 2018-07-15 LAB — COMPREHENSIVE METABOLIC PANEL
ALT: 75 U/L — ABNORMAL HIGH (ref 0–44)
ANION GAP: 6 (ref 5–15)
AST: 189 U/L — ABNORMAL HIGH (ref 15–41)
Albumin: 2.6 g/dL — ABNORMAL LOW (ref 3.5–5.0)
Alkaline Phosphatase: 165 U/L — ABNORMAL HIGH (ref 38–126)
BUN: 30 mg/dL — ABNORMAL HIGH (ref 8–23)
CO2: 21 mmol/L — ABNORMAL LOW (ref 22–32)
Calcium: 9.3 mg/dL (ref 8.9–10.3)
Chloride: 129 mmol/L — ABNORMAL HIGH (ref 98–111)
Creatinine, Ser: 1.16 mg/dL — ABNORMAL HIGH (ref 0.44–1.00)
GFR calc Af Amer: 57 mL/min — ABNORMAL LOW (ref >60)
GFR calc non Af Amer: 49 mL/min — ABNORMAL LOW (ref >60)
Glucose, Bld: 115 mg/dL — ABNORMAL HIGH (ref 70–99)
Potassium: 4.3 mmol/L (ref 3.5–5.1)
Sodium: 156 mmol/L — ABNORMAL HIGH (ref 135–145)
Total Bilirubin: 2.8 mg/dL — ABNORMAL HIGH (ref 0.3–1.2)
Total Protein: 6.1 g/dL — ABNORMAL LOW (ref 6.5–8.1)

## 2018-07-15 LAB — BASIC METABOLIC PANEL
ANION GAP: 12 (ref 5–15)
BUN: 31 mg/dL — ABNORMAL HIGH (ref 8–23)
CO2: 17 mmol/L — ABNORMAL LOW (ref 22–32)
Calcium: 9.3 mg/dL (ref 8.9–10.3)
Chloride: 127 mmol/L — ABNORMAL HIGH (ref 98–111)
Creatinine, Ser: 1.32 mg/dL — ABNORMAL HIGH (ref 0.44–1.00)
GFR calc non Af Amer: 42 mL/min — ABNORMAL LOW (ref >60)
GFR, EST AFRICAN AMERICAN: 49 mL/min — AB (ref >60)
Glucose, Bld: 114 mg/dL — ABNORMAL HIGH (ref 70–99)
Potassium: 4.9 mmol/L (ref 3.5–5.1)
Sodium: 156 mmol/L — ABNORMAL HIGH (ref 135–145)

## 2018-07-15 LAB — LACTIC ACID, PLASMA
Lactic Acid, Venous: 5.2 mmol/L (ref 0.5–1.9)
Lactic Acid, Venous: 4.2 mmol/L (ref 0.5–1.9)

## 2018-07-15 LAB — PROTIME-INR
INR: 1.98
Prothrombin Time: 22.2 seconds — ABNORMAL HIGH (ref 11.4–15.2)

## 2018-07-15 LAB — ECHOCARDIOGRAM COMPLETE
Height: 60 in
Weight: 1971.79 oz

## 2018-07-15 LAB — TSH: TSH: 1.719 u[IU]/mL (ref 0.350–4.500)

## 2018-07-15 LAB — MAGNESIUM: MAGNESIUM: 1.7 mg/dL (ref 1.7–2.4)

## 2018-07-15 LAB — TROPONIN I: Troponin I: 0.1 ng/mL (ref <0.03)

## 2018-07-15 MED ORDER — PRO-STAT SUGAR FREE PO LIQD
30.0000 mL | Freq: Two times a day (BID) | ORAL | Status: DC
  Administered 2018-07-15 – 2018-07-17 (×6): 30 mL
  Filled 2018-07-15 (×6): qty 30

## 2018-07-15 MED ORDER — LACTATED RINGERS IV BOLUS
500.0000 mL | Freq: Once | INTRAVENOUS | Status: AC
  Administered 2018-07-15: 500 mL via INTRAVENOUS

## 2018-07-15 MED ORDER — ORAL CARE MOUTH RINSE
15.0000 mL | OROMUCOSAL | Status: DC
  Administered 2018-07-15 – 2018-07-18 (×29): 15 mL via OROMUCOSAL

## 2018-07-15 MED ORDER — METOPROLOL TARTRATE 5 MG/5ML IV SOLN
5.0000 mg | Freq: Once | INTRAVENOUS | Status: AC
  Administered 2018-07-15: 5 mg via INTRAVENOUS

## 2018-07-15 MED ORDER — VITAL AF 1.2 CAL PO LIQD
1000.0000 mL | ORAL | Status: DC
  Administered 2018-07-15: 1000 mL

## 2018-07-15 MED ORDER — METOPROLOL TARTRATE 5 MG/5ML IV SOLN
2.5000 mg | Freq: Once | INTRAVENOUS | Status: AC
  Administered 2018-07-15: 2.5 mg via INTRAVENOUS

## 2018-07-15 MED ORDER — FENTANYL CITRATE (PF) 100 MCG/2ML IJ SOLN
100.0000 ug | Freq: Once | INTRAMUSCULAR | Status: AC
  Administered 2018-07-15: 100 ug via INTRAVENOUS

## 2018-07-15 MED ORDER — SODIUM BICARBONATE 8.4 % IV SOLN
50.0000 meq | Freq: Once | INTRAVENOUS | Status: AC
  Administered 2018-07-15: 50 meq via INTRAVENOUS

## 2018-07-15 MED ORDER — MIDAZOLAM HCL 2 MG/2ML IJ SOLN
2.0000 mg | Freq: Once | INTRAMUSCULAR | Status: AC
  Administered 2018-07-15: 2 mg via INTRAVENOUS

## 2018-07-15 MED ORDER — DEXTROSE 5 % IV SOLN
INTRAVENOUS | Status: DC
  Administered 2018-07-15: 50 mL/h via INTRAVENOUS
  Administered 2018-07-15: 50 mL via INTRAVENOUS
  Administered 2018-07-16: 05:00:00 via INTRAVENOUS

## 2018-07-15 MED ORDER — ETOMIDATE 2 MG/ML IV SOLN
20.0000 mg | Freq: Once | INTRAVENOUS | Status: AC
  Administered 2018-07-15: 20 mg via INTRAVENOUS

## 2018-07-15 MED ORDER — SODIUM BICARBONATE 8.4 % IV SOLN
INTRAVENOUS | Status: AC
  Administered 2018-07-15: 50 meq via INTRAVENOUS
  Filled 2018-07-15: qty 50

## 2018-07-15 MED ORDER — METOPROLOL TARTRATE 25 MG PO TABS
25.0000 mg | ORAL_TABLET | Freq: Two times a day (BID) | ORAL | Status: DC
  Administered 2018-07-15 – 2018-07-17 (×5): 25 mg
  Filled 2018-07-15 (×5): qty 1

## 2018-07-15 MED ORDER — PHENYLEPHRINE HCL-NACL 10-0.9 MG/250ML-% IV SOLN
INTRAVENOUS | Status: AC
  Administered 2018-07-15: 10 mg
  Filled 2018-07-15: qty 250

## 2018-07-15 MED ORDER — METOPROLOL TARTRATE 5 MG/5ML IV SOLN
INTRAVENOUS | Status: AC
  Administered 2018-07-15: 5 mg
  Filled 2018-07-15: qty 5

## 2018-07-15 MED ORDER — ETOMIDATE 2 MG/ML IV SOLN
INTRAVENOUS | Status: DC | PRN
  Administered 2018-07-15: 12 mg via INTRAVENOUS

## 2018-07-15 MED ORDER — ADENOSINE 6 MG/2ML IV SOLN
6.0000 mg | Freq: Once | INTRAVENOUS | Status: AC
  Administered 2018-07-15: 6 mg via INTRAVENOUS

## 2018-07-15 MED ORDER — METOPROLOL TARTRATE 5 MG/5ML IV SOLN
INTRAVENOUS | Status: AC
  Filled 2018-07-15: qty 5

## 2018-07-15 MED ORDER — ROCURONIUM BROMIDE 50 MG/5ML IV SOLN
50.0000 mg | Freq: Once | INTRAVENOUS | Status: AC
  Administered 2018-07-15: 50 mg via INTRAVENOUS

## 2018-07-15 MED ORDER — CHLORHEXIDINE GLUCONATE 0.12% ORAL RINSE (MEDLINE KIT)
15.0000 mL | Freq: Two times a day (BID) | OROMUCOSAL | Status: DC
  Administered 2018-07-15 – 2018-07-18 (×7): 15 mL via OROMUCOSAL

## 2018-07-15 MED ORDER — ADENOSINE 6 MG/2ML IV SOLN
INTRAVENOUS | Status: AC
  Filled 2018-07-15: qty 2

## 2018-07-15 MED ORDER — ALBUMIN HUMAN 5 % IV SOLN
25.0000 g | Freq: Once | INTRAVENOUS | Status: AC
  Administered 2018-07-15: 25 g via INTRAVENOUS
  Filled 2018-07-15: qty 500

## 2018-07-15 MED ORDER — FENTANYL CITRATE (PF) 100 MCG/2ML IJ SOLN
25.0000 ug | Freq: Once | INTRAMUSCULAR | Status: DC
  Filled 2018-07-15: qty 2

## 2018-07-15 MED ORDER — LEVALBUTEROL HCL 0.63 MG/3ML IN NEBU: 0.6300 mg | INHALATION_SOLUTION | Freq: Once | RESPIRATORY_TRACT | Status: DC

## 2018-07-15 MED ORDER — LEVALBUTEROL HCL 1.25 MG/0.5ML IN NEBU
1.2500 mg | INHALATION_SOLUTION | Freq: Once | RESPIRATORY_TRACT | Status: AC
  Administered 2018-07-15: 1.25 mg via RESPIRATORY_TRACT
  Filled 2018-07-15: qty 0.5

## 2018-07-15 NOTE — Progress Notes (Signed)
Patient H.R. went up 150-170 S.V.T. S.B.P. drop 70-80 Skin cool dry Toes and heels of feet and knees bluish color. E.K.G. to confirm rhythm. David Rapid Resp. Call and ask to come see patient Charge R.N. into assess patient, Resp ther. Call to come see patient called Jovita Kussmaul N.P. and made her aware of V.S. and patient condition. Patient still using abdo. Muscles to breathe. o2 sats fluctuating see Vital sign Flow sheet. See orders for L.R. 500 ml I.V. fl. Bolus and Lopressor 5 mg I.V. Heart rate down to 140's still has low B.P. Janyth Contes N. P. Aware of H.R. and she order to give Lopressor 2.5 mg I.V. Also patient has not voided. See orders for Foley to be inserted and F 14 inserted and patient tol. well 275 ml of amber urine obtain. Resp. There.. here and titrated 02 as needed. Albumin 500 ml given per orders. And Sodium Bicarb was given by Delma Post.  A.B.G.'s were done  Other lab work was order and done urine was sent to lab. Manual B.P. were done unable to get on monitor.Rapid Resp and N.P. and Resp. Ther. stayed in room and with patient til moved to ICU Critical care P.A. and Dr. Hyacinth Meeker arrived and wanted patient to be transfer to ICU. At 06:20 patient was transfer to 2H20  With Rapid Resp. R.N. Resp.Ther. And 2 R.N.'s and patient belongings on Monitor and with o2. Brother -in-law Marion Downer is P.O.A. and was called and made aware of patient cond.. And that patient has been moved to 2H20  For D.C.C.V. at 06:35

## 2018-07-15 NOTE — Procedures (Signed)
Central Venous Catheter Insertion Procedure Note Alyssa Mckenzie [id number] 15-Oct-1950  Procedure: Insertion of Central Venous Catheter Indications: Assessment of intravascular volume, Drug and/or fluid administration and Frequent blood sampling  Procedure Details Consent: Unable to obtain consent because of emergent medical necessity. Time Out: Verified patient identification, verified procedure, site/side was marked, verified correct patient position, special equipment/implants available, medications/allergies/relevent history reviewed, required imaging and test results available.  Performed  Maximum sterile technique was used including antiseptics, cap, gloves, gown, hand hygiene, mask and sheet. Skin prep: Chlorhexidine; local anesthetic administered A antimicrobial bonded/coated triple lumen catheter was placed in the left internal jugular vein using the Seldinger technique. Ultrasound guidance used.Yes.   Catheter placed to 20 cm. Blood aspirated via all 3 ports and then flushed x 3. Line sutured x 2 and dressing applied.  Evaluation Blood flow good Complications: No apparent complications Patient did tolerate procedure well. Chest X-ray ordered to verify placement.  CXR: pending.    Brett Canales Minor ACNP Adolph Pollack PCCM Pager [phone] till 1 pm If no answer page [phone] 07/15/2018, 9:01 AM

## 2018-07-15 NOTE — Anesthesia Procedure Notes (Signed)
Procedure Name: General with mask airway Date/Time: 07/15/2018 6:40 AM Performed by: Rachel Moulds, CRNA Pre-anesthesia Checklist: Emergency Drugs available, Timeout performed, Patient being monitored, Suction available and Patient identified Patient Re-evaluated:Patient Re-evaluated prior to induction Oxygen Delivery Method: Ambu bag Preoxygenation: Pre-oxygenation with 100% oxygen Induction Type: IV induction Ventilation: Mask ventilation without difficulty Placement Confirmation: positive ETCO2 and breath sounds checked- equal and bilateral Dental Injury: Teeth and Oropharynx as per pre-operative assessment

## 2018-07-15 NOTE — Progress Notes (Signed)
Brief Cardiology Note  Paged by RN that patient became acutely tachycardic to the 140s.  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  Adenosine 6 mg IV was given, which terminated SVT.  I have increased metoprolol to 25 mg bid to hopefully help prevent further episodes.  Amiodarone is likely contraindicated given cirrhosis.

## 2018-07-15 NOTE — Progress Notes (Signed)
Patient stated' I am having trouble breathing." Resp. Therapist call and into see patient and she suggest a P.R.N. breathing Treatment. Patient having some wheezing. [place] N.P. call and made aware . Breathing treatment ordered and given by R.T. and patient tol. well and was no longer S.O.B.

## 2018-07-15 NOTE — Progress Notes (Signed)
CRITICAL VALUE ALERT  Critical Value:  Lactic 4.2   Date & Time Notied:  07/15/18 1045   Actions taken: Patient's RN notified

## 2018-07-15 NOTE — Consult Note (Signed)
NAME:  Alyssa OdeaCynthia Mckenzie, MRN:  [id number], DOB:  [birth year], LOS: 2 ADMISSION DATE:  07/17/2018, CONSULTATION DATE: 07/15/2018 REFERRING MD: [facility], CHIEF COMPLAINT: Supraventricular tachycardia unstable  Brief History   68 year old failure to thrive patient with multiple medical comorbidities developed supraventricular tachycardia required cardioversion with sedation 07/15/2018  History of present illness   68 year old female who has hepatic cirrhosis with hepatic encephalopathy who is admitted for weakness nausea evaluated by renal for increasing creatinine and GI services for hepatic encephalopathy.  She was in her normal state of poor health on 07/15/2018 and required transfer to intensive care unit for refractory supraventricular cardia.  Blockade was attempted IV without success.  Anesthesia was activated she was given etomidate and cardioverted successfully to a sinus tach of 107.  She has a known elevated ammonia level and is on lactulose and rifampin.  She has hypernatremia for which she has been on free water although now her NG tube is out.  He is a full code pulmonary critical care is been asked to serve her care at this time.  There is a good chance she require intubation for the days over.  Past Medical History  Hepatic encephalopathy Renal failure Failure to thrive Esophageal stricture Former tobacco abuse 2H abuse  Significant Hospital Events   07/15/2018 developed supraventricular cardia required cardioversion  Consults:  Renal GI Pulmonary critical care  Procedures:  07/15/2018 0 654 cardioversion version for supra ventricular tachycardia  Significant Diagnostic Tests:  07/15/2018 2D echo>>  Micro Data:    Antimicrobials:    Interim history/subjective:  Transferred to the intensive care unit for some treatment of supra ventricular tachycardia on 07/15/2018 0 654 with cardioversion and sedation per anesthesia with etomidate.  Objective   Blood pressure 114/66, pulse  (!) 106, temperature 98.9 F (37.2 C), temperature source Oral, resp. rate 11, height 5' (1.524 m), weight 55.9 kg, SpO2 96 %.    FiO2 (%):  [97 %-99 %] 99 %   Intake/Output Summary (Last 24 hours) at 07/15/2018 0707 Last data filed at 07/15/2018 0600 Gross per 24 hour  Intake 1050 ml  Output 2015 ml  Net -965 ml   Filed Weights   07/13/18 0441 07/14/18 0642 07/15/18 0340  Weight: 48.5 kg 58.9 kg 55.9 kg    Examination: General: Frail female appears older than stated age of 68 HENT: No JVD or lymphadenopathy is appreciated Lungs: Decreased breath sounds throughout, mild stridor from time following in the back of airway Cardiovascular: Heart sounds are distant, and heart rate 108 sinus tach status post cardioversion at 0654 hrs. Abdomen: Soft nontender faint bowel sounds Extremities: Mild edema, multiple areas of ecchymosis covering body Neuro: Poorly responsive.  Barely responds to painful stimuli.  Not sure of her baseline she is being treated for hepatic encephalopathy prior to being sedated for cardioversion   Resolved Hospital Problem list     Assessment & Plan:  DeCrease level consciousness secondary to use of etomidate for cardioversion on 07/15/2018 at 0654 hrs.  Complicated by hepatic encephalopathy from cirrhosis. Monitor respiratory status Oxygen as needed to keep sats greater than 90% Transfer to the 07/15/2018 for supraventricular cardia which has been done Cardiac monitoring Stat electrolyte panel  Supraventricular tachycardia requiring cardioversion 07/15/2018 at 0 654 being sedated by anesthesia.  She failed beta-blockade to control rate that was greater than 170.  Currently in sinus tach 106 Cardiac monitor Beta-blockade in place Will consider Cardizem drip if blood pressure tolerates Consider cardiology consult Monitor troponin Twelve-lead EKG  Sedated with poor respiratory status following cardioversion on 07/15/2018 0654 hrs. O2 as needed May need  intubation if she does not wake up more protect her airway Continued observation intensive care unit with ongoing evaluation of her airway and ventilation   Acute kidney injury Hypernatremia Lab Results  Component Value Date   CREATININE 1.16 (H) 07/15/2018   CREATININE 1.24 (H) 07/14/2018   CREATININE 1.88 (H) 07/13/2018   Recent Labs  Lab 07/13/18 0410 07/14/18 0337 07/15/18 0319  NA 148* 153* 156*    Note renal consult on 07/12/2018 Creatinine stable at 1.16 Gentle hydration Avoid nephrotoxins Free water when NG tube is been replaced Start D5W at 50 cc an hour  Decompensated cirrhosis with hepatic encephalopathy evaluated by gastroenterology on 07/12/2018 at 4:15 PM Ammonia level peak of 184 now 79 on 07/15/2018 On rifampin and lactulose NG tube is been pulled out will need to be reinserted to continue medications PPI   Best practice:  Diet: N.p.o. Pain/Anxiety/Delirium protocol (if indicated): Decreased level of consciousness hepatic encephalopathy precludes sedation VAP protocol (if indicated): Not of yet DVT prophylaxis: Elevated INR from hepatic failure GI prophylaxis: PPI Glucose control: Scale insulin protocol Mobility: Bedrest Code Status full Family Communication: 07/15/2018 no family at bedside Disposition: She has been transferred to intensive care unit undergo cardioversion and now has a decreased level of consciousness.  Labs   CBC: Recent Labs  Lab 2018-08-06 1739 07/11/18 0612 07/12/18 0450 07/13/18 0410 07/14/18 0337 07/15/18 0518  WBC 5.5 8.1 6.7 8.8 9.7 12.9*  NEUTROABS 4.4  --  6.0 7.8*  --   --   HGB 9.6* 9.8* 9.2* 8.9* 9.9* 10.1*  HCT 29.3* 29.9* 28.1* 26.4* 29.7* 32.1*  MCV 91.8 92.3 94.3 92.6 90.8 95.3  PLT 178 218 173 175 156 191    Basic Metabolic Panel: Recent Labs  Lab 06-Aug-2018 1749 07/11/18 0612 07/12/18 0450 07/13/18 0410 07/14/18 0337 07/15/18 0319  NA  --  139 143 148* 153* 156*  K  --  3.7 3.2* 3.0* 3.5 4.3  CL  --   110 114* 123* 125* 129*  CO2  --  15* 16* 16* 18* 21*  GLUCOSE  --  81 92 144* 122* 115*  BUN  --  38* 40* 38* 34* 30*  CREATININE  --  2.45* 2.39* 1.88* 1.24* 1.16*  CALCIUM  --  8.2* 8.3* 8.3* 8.9 9.3  MG 2.2  --   --  1.8 1.8 1.7   GFR: Estimated Creatinine Clearance: 37.4 mL/min (A) (by C-G formula based on SCr of 1.16 mg/dL (H)). Recent Labs  Lab August 06, 2018 1739  07/12/18 0450 07/13/18 0410 07/14/18 0337 07/15/18 0518  WBC 5.5   < > 6.7 8.8 9.7 12.9*  LATICACIDVEN 1.7  --   --   --   --  5.2*   < > = values in this interval not displayed.    Liver Function Tests: Recent Labs  Lab 08/06/18 1739 07/13/18 0410 07/14/18 0337 07/15/18 0319  AST 110* 211* 198* 189*  ALT 43 66* 71* 75*  ALKPHOS 145* 136* 152* 165*  BILITOT 2.2* 2.4* 2.4* 2.8*  PROT 6.5 5.8* 6.1* 6.1*  ALBUMIN 2.9* 2.4* 2.5* 2.6*   No results for input(s): LIPASE, AMYLASE in the last 168 hours. Recent Labs  Lab 08/06/2018 1739 07/11/18 1247 07/12/18 0450 07/13/18 0410 07/15/18 0518  AMMONIA 85* 66* 83* 184* 79*    ABG    Component Value Date/Time   PHART 7.322 (L) 07/15/2018 0455  PCO2ART 30.0 (L) 07/15/2018 0455   PO2ART 62.2 (L) 07/15/2018 0455   HCO3 15.0 (L) 07/15/2018 0455   ACIDBASEDEF 9.8 (H) 07/15/2018 0455   O2SAT 88.1 07/15/2018 0455     Coagulation Profile: Recent Labs  Lab 07/11/18 0002 07/15/18 0319  INR 1.46 1.98    Cardiac Enzymes: Recent Labs  Lab 07/16/2018 1739 07/11/18 0002 07/11/18 0612 07/11/18 1247  TROPONINI 0.04* 0.03* 0.04* 0.04*    HbA1C: Hgb A1c MFr Bld  Date/Time Value Ref Range Status  07/11/2018 12:04 AM 5.2 4.8 - 5.6 % Final    Comment:    (NOTE) Pre diabetes:          5.7%-6.4% Diabetes:              >6.4% Glycemic control for   <7.0% adults with diabetes     CBG: No results for input(s): GLUCAP in the last 168 hours.  Review of Systems:   Not available secondary to altered mental status  Past Medical History  She,  has a past  medical history of CHF (congestive heart failure) (HCC), COPD (chronic obstructive pulmonary disease) (HCC), and Liver failure (HCC).   Surgical History    Past Surgical History:  Procedure Laterality Date  . ABDOMINAL HYSTERECTOMY    . BLADDER SURGERY    . BREAST SURGERY    . CHOLECYSTECTOMY    . TONSILLECTOMY       Social History   reports that she has quit smoking. She has never used smokeless tobacco. She reports current alcohol use of about 4.0 standard drinks of alcohol per week. She reports that she does not use drugs.   Family History   Her family history includes Congestive Heart Failure in her father.   Allergies Allergies  Allergen Reactions  . Statins Other (See Comments)    aching     Home Medications  Prior to Admission medications   Medication Sig Start Date End Date Taking? Authorizing Provider  albuterol (PROVENTIL HFA;VENTOLIN HFA) 108 (90 Base) MCG/ACT inhaler Inhale into the lungs every 6 (six) hours as needed for wheezing or shortness of breath.   Yes [provider]  Cholecalciferol (VITAMIN D3) 125 MCG (5000 UT) TABS Take 1 tablet by mouth daily.   Yes [provider]  fluticasone-salmeterol (ADVAIR HFA) 115-21 MCG/ACT inhaler Inhale 2 puffs into the lungs 2 (two) times daily.   Yes [provider]  furosemide (LASIX) 20 MG tablet Take 20 mg by mouth daily.   Yes [provider]  gabapentin (NEURONTIN) 400 MG capsule Take 800 mg by mouth 3 (three) times daily. 04/11/18  Yes [provider]  hydrOXYzine (ATARAX/VISTARIL) 50 MG tablet Take 1 tablet (50 mg total) by mouth every 4 (four) hours as needed for itching. 04/23/18  Yes Marinda Elk, MD  ibuprofen (ADVIL,MOTRIN) 200 MG tablet Take 400 mg by mouth at bedtime as needed for moderate pain.   Yes [provider]  metoprolol tartrate (LOPRESSOR) 25 MG tablet Take 25 mg by mouth daily.   Yes [provider]  omeprazole (PRILOSEC) 40 MG  capsule Take 40 mg by mouth daily. 04/17/17  Yes [provider]  ondansetron (ZOFRAN-ODT) 4 MG disintegrating tablet Take 4 mg by mouth every 6 (six) hours as needed for nausea.  07/09/18  Yes [provider]  triamcinolone cream (KENALOG) 0.1 % Apply 1 application topically daily as needed for itching. 04/16/18  Yes [provider]  TURMERIC PO Take 2 tablets by mouth daily.  Yes [provider]  umeclidinium bromide (INCRUSE ELLIPTA) 62.5 MCG/INH AEPB Inhale 1 puff into the lungs daily.   Yes [provider]  predniSONE (DELTASONE) 20 MG tablet Take by mouth daily. Taper 04/16/18   [provider]     Critical care time: 45 min    Brett CanalesSteve Teneshia Hedeen ACNP [facility] PCCM Pager [phone] till 1 pm If no answer page [phone] 07/15/2018, 7:07 AM

## 2018-07-15 NOTE — Transfer of Care (Signed)
OOD cardioversion

## 2018-07-15 NOTE — Procedures (Signed)
Intubation Procedure Note Alyssa Mckenzie [id number] May 22, 1951  Procedure: Intubation Indications: Respiratory insufficiency  Procedure Details Consent: Unable to obtain consent because of emergent medical necessity. Time Out: Verified patient identification, verified procedure, site/side was marked, verified correct patient position, special equipment/implants available, medications/allergies/relevent history reviewed, required imaging and test results available.  Performed  MAC and 3 Medications:  Fentanyl  Etomidate Versed NMB    Evaluation Hemodynamic Status: Transient hypotension treated with pressors and fluid; O2 sats: stable throughout Patient's Current Condition: stable Complications: No apparent complications Patient did tolerate procedure well. Chest X-ray ordered to verify placement.  CXR: pending.   Alyssa Mckenzie ACNP Alyssa Mckenzie PCCM Pager [phone] till 3 pm If no answer page [phone] 07/15/2018, 9:00 AM

## 2018-07-15 NOTE — Consult Note (Signed)
Cardiology Consultation:   Patient ID: Alyssa Mckenzie MRN: [id number]; DOB: 09/07/1950  Admit date: 07/20/2018 Date of Consult: 07/15/2018  Primary Care Provider: Patient, No Pcp Per Primary Cardiologist: No primary care provider on file. Dr. Tommi Rumps, [facility] Primary Electrophysiologist:  None    Patient Profile:   Alyssa Mckenzie is a 68 y.o. female with a hx of liver disease who is being seen today for the evaluation of SVT at the request of Dr. Hale Bogus.  History of Present Illness:   Alyssa Mckenzie who was admitted with GI issues, in particular liver failure.  Earlier today, she went into a rapid rhythm.  It was narrow complex.  Records show that the heart rate reached 180 to 190 bpm.  She was given 5 of metoprolol with some improvement.  Heart rates came down into the 140s.  She then became hypotensive and was ultimately cardioverted electrically.  Low-dose beta-blocker was added.  She was intubated.  Since that time, she has not had any further SVT.  Past Medical History:  Diagnosis Date  . CHF (congestive heart failure) ([place])   . COPD (chronic obstructive pulmonary disease) ([place])   . Liver failure [facility])     Past Surgical History:  Procedure Laterality Date  . ABDOMINAL HYSTERECTOMY    . BLADDER SURGERY    . BREAST SURGERY    . CHOLECYSTECTOMY    . TONSILLECTOMY        Inpatient Medications: Scheduled Meds: . chlorhexidine gluconate (MEDLINE KIT)  15 mL Mouth Rinse BID  . feeding supplement (PRO-STAT SUGAR FREE 64)  30 mL Per Tube BID  . fentaNYL (SUBLIMAZE) injection  25 mcg Intravenous Once  . folic acid  1 mg Per Tube Daily  . free water  250 mL Per Tube Q6H  . lactulose  30 g Per Tube Q6H  . mouth rinse  15 mL Mouth Rinse 10 times per day  . metoprolol tartrate  12.5 mg Per Tube Daily  . nystatin  5 mL Oral QID  . pantoprazole sodium  40 mg Per Tube Daily  . rifaximin  550 mg Per Tube BID  . thiamine  100 mg Per Tube Daily  . umeclidinium bromide  1 puff  Inhalation Daily   Continuous Infusions: . sodium chloride 10 mL/hr at 07/12/18 1945  . dextrose 50 mL/hr at 07/15/18 1500  . feeding supplement (VITAL AF 1.2 CAL) 1,000 mL (07/15/18 1340)   PRN Meds: sodium chloride, ondansetron **OR** ondansetron (ZOFRAN) IV  Allergies:    Allergies  Allergen Reactions  . Statins Other (See Comments)    aching    Social History:   Social History   Socioeconomic History  . Marital status: Widowed    Spouse name: Not on file  . Number of children: Not on file  . Years of education: Not on file  . Highest education level: Not on file  Occupational History  . Not on file  Social Needs  . Financial resource strain: Not on file  . Food insecurity:    Worry: Not on file    Inability: Not on file  . Transportation needs:    Medical: Not on file    Non-medical: Not on file  Tobacco Use  . Smoking status: Former Research scientist (life sciences)  . Smokeless tobacco: Never Used  Substance and Sexual Activity  . Alcohol use: Yes    Alcohol/week: 4.0 standard drinks    Types: 4 Glasses of wine per week    Comment: 4 glasses/day  . Drug  use: Never  . Sexual activity: Not on file  Lifestyle  . Physical activity:    Days per week: Not on file    Minutes per session: Not on file  . Stress: Not on file  Relationships  . Social connections:    Talks on phone: Not on file    Gets together: Not on file    Attends religious service: Not on file    Active member of club or organization: Not on file    Attends meetings of clubs or organizations: Not on file    Relationship status: Not on file  . Intimate partner violence:    Fear of current or ex partner: Not on file    Emotionally abused: Not on file    Physically abused: Not on file    Forced sexual activity: Not on file  Other Topics Concern  . Not on file  Social History Narrative  . Not on file    Family History:    Family History  Problem Relation Age of Onset  . Congestive Heart Failure Father       ROS:  Please see the history of present illness.  Significant for SVT earlier today All other ROS reviewed and negative.     Physical Exam/Data:   Vitals:   07/15/18 1300 07/15/18 1400 07/15/18 1500 07/15/18 1600  BP: 123/66 114/69 121/74 107/61  Pulse: (!) 106 (!) 102 (!) 106 (!) 111  Resp: '20 16 18 '$ (!) 26  Temp:      TempSrc:      SpO2: 96% 96% 96% 94%  Weight:      Height:        Intake/Output Summary (Last 24 hours) at 07/15/2018 1749 Last data filed at 07/15/2018 1500 Gross per 24 hour  Intake 1665.46 ml  Output 1225 ml  Net 440.46 ml   Last 3 Weights 07/15/2018 07/14/2018 07/14/2018  Weight (lbs) 123 lb 3.8 oz 129 lb 13.6 oz (No Data)  Weight (kg) 55.9 kg 58.9 kg (No Data)     Body mass index is 24.07 kg/m.  General: Intubated, sedated, frail HEENT: Atraumatic  Neck: no JVD Endocrine:  No thryomegaly Vascular: No carotid bruits; FA pulses 2+ bilaterally without bruits  Cardiac:  normal S1, S2; tachycardic; Lungs: Coarse breath sounds to auscultation bilaterally, Abd: soft, nontender,  Ext: no edema Musculoskeletal:  No deformities, Skin: warm and dry  Neuro: Sedated Psych: Sedated  EKG:  The EKG was personally reviewed and demonstrates: Sinus tachycardia, low voltage Telemetry:  Telemetry was personally reviewed and demonstrates: Sinus rhythm with intermittent SVT  Relevant CV Studies: Echocardiogram showed normal left ventricular ejection fraction; moderate RV dysfunction, normal valvular function  Laboratory Data:  Chemistry Recent Labs  Lab 07/14/18 0337 07/15/18 0319 07/15/18 0932 07/15/18 0939  NA 153* 156* 158* 156*  K 3.5 4.3 4.6 4.9  CL 125* 129*  --  127*  CO2 18* 21*  --  17*  GLUCOSE 122* 115*  --  114*  BUN 34* 30*  --  31*  CREATININE 1.24* 1.16*  --  1.32*  CALCIUM 8.9 9.3  --  9.3  GFRNONAA 45* 49*  --  42*  GFRAA 52* 57*  --  49*  ANIONGAP 10 6  --  12    Recent Labs  Lab 07/13/18 0410 07/14/18 0337 07/15/18 0319   PROT 5.8* 6.1* 6.1*  ALBUMIN 2.4* 2.5* 2.6*  AST 211* 198* 189*  ALT 66* 71* 75*  ALKPHOS 136* 152* 165*  BILITOT 2.4* 2.4* 2.8*   Hematology Recent Labs  Lab 07/13/18 0410 07/14/18 0337 07/15/18 0518 07/15/18 0932  WBC 8.8 9.7 12.9*  --   RBC 2.85* 3.27*  3.27* 3.37*  --   HGB 8.9* 9.9* 10.1* 8.5*  HCT 26.4* 29.7* 32.1* 25.0*  MCV 92.6 90.8 95.3  --   MCH 31.2 30.3 30.0  --   MCHC 33.7 33.3 31.5  --   RDW 18.8* 18.8* 20.4*  --   PLT 175 156 191  --    Cardiac Enzymes Recent Labs  Lab 07/05/2018 1739 07/11/18 0002 07/11/18 0612 07/11/18 1247 07/15/18 0939  TROPONINI 0.04* 0.03* 0.04* 0.04* 0.10*   No results for input(s): TROPIPOC in the last 168 hours.  BNP Recent Labs  Lab 07/11/18 0002  BNP 480.1*    DDimer No results for input(s): DDIMER in the last 168 hours.  Radiology/Studies:  Dg Abd 1 View  Result Date: 07/14/2018 CLINICAL DATA:  Check gastric catheter placement EXAM: ABDOMEN - 1 VIEW COMPARISON:  07/12/2018 FINDINGS: Scattered large and small bowel gas is noted. Gastric catheter is noted within the stomach. Proximal side-port is noted at the gastroesophageal junction stable from the previous exam. The catheter could be advanced as clinically indicated. No acute bony abnormality is noted. Patchy infiltrative changes are noted in the bases bilaterally. IMPRESSION: Gastric catheter within the stomach although the proximal side port lies at the gastroesophageal junction. This could be advanced several cm as clinically indicated. Electronically Signed   By: Inez Catalina M.D.   On: 07/14/2018 18:58   Dg Abd 1 View  Result Date: 07/12/2018 CLINICAL DATA:  Nasogastric tube placement EXAM: ABDOMEN - 1 VIEW COMPARISON:  Portable exam 1834 hours compared to CT abdomen and pelvis of 04/21/2018 FINDINGS: Tip of nasogastric tube projects over stomach though the proximal side-port may be at or just above the gastroesophageal junction, consider advancing tube 3-4 cm.  Extensive interstitial infiltrates at lung bases. Bowel gas pattern normal. Bones demineralized. IMPRESSION: Consider advancing nasogastric tube 3-4 cm Electronically Signed   By: Lavonia Dana M.D.   On: 07/12/2018 18:58   US Renal  Result Date: 07/12/2018 CLINICAL DATA:  Acute renal failure EXAM: RENAL / URINARY TRACT ULTRASOUND COMPLETE COMPARISON:  CT abdomen pelvis 04/21/2018 FINDINGS: Right Kidney: Renal measurements: 10.2 x 4.2 x 4.9 cm = volume: 109.6 mL . Echogenicity within normal limits. No mass or hydronephrosis visualized. Left Kidney: Renal measurements: 10.7 x 4.5 x 4.8 cm = volume: 120.8 mL. Echogenicity within normal limits. No mass or hydronephrosis visualized. Bladder: Appears normal for degree of bladder distention. Increased hepatic parenchymal echogenicity. IMPRESSION: No hydronephrosis. Hepatic steatosis. Electronically Signed   By: Lovey Newcomer M.D.   On: 07/12/2018 14:42   Dg Chest Port 1 View  Result Date: 07/15/2018 CLINICAL DATA:  Central line placement. EXAM: PORTABLE CHEST 1 VIEW 8:59 a.m. COMPARISON:  07/15/2018 at 6:39 a.m. and 07/14/2018 and 07/11/2018 FINDINGS: Endotracheal tube is in good position 3.7 cm above the carina. Tip of the central venous catheter is in the superior vena cava at the level of the carina. NG tube tip is below the diaphragm. There is diffuse accentuation of the interstitial markings in the left lung as well as prominent interstitial accentuation at the right lung base with sparing of the right upper lung zone. This is unchanged since earlier on the same date. I suspect this represents interstitial pulmonary edema. There is increased atelectasis in the left upper lobe medially as compared to  the study of 07/11/2018. No discrete effusions.  No acute bone abnormality. Aortic atherosclerosis. IMPRESSION: 1. Endotracheal tube and central line appear in good position. 2. Persistent accentuation of the interstitial markings bilaterally consistent with  interstitial edema. Electronically Signed   By: Lorriane Shire M.D.   On: 07/15/2018 09:32   Dg Chest Port 1 View  Result Date: 07/15/2018 CLINICAL DATA:  Acute respiratory failure. EXAM: PORTABLE CHEST 1 VIEW COMPARISON:  Earlier today FINDINGS: Apical emphysematous appearance. Generalized interstitial airspace opacity with mild left-sided asymmetry. Normal heart size and mediastinal contours. Naso enteric tube at least reaches the diaphragm. Defibrillator pad present. IMPRESSION: 1. Unchanged interstitial opacity favoring edema. 2. Emphysema. Electronically Signed   By: Monte Fantasia M.D.   On: 07/15/2018 07:36   Dg Chest Port 1 View  Result Date: 07/14/2018 CLINICAL DATA:  hypoxia EXAM: PORTABLE CHEST - 1 VIEW COMPARISON:  07/11/2018 FINDINGS: Worsening bibasilar edema or infiltrates left greater than right. Attenuated bronchovascular markings in the right upper lung. Heart size and mediastinal contours are within normal limits. No definite effusion. No pneumothorax. Gastric tube extends to the stomach. Visualized bones unremarkable. IMPRESSION: Worsening bibasilar infiltrates or edema, left greater than right. Electronically Signed   By: Lucrezia Europe M.D.   On: 07/14/2018 14:47    Assessment and Plan:   1. SVT: Continue low-dose beta-blocker.  This is likely due to the stress of her GI illness. 2. RV dysfunction/pulmonary hypertension: This pulmonary hypertension was noted on the visit with her regular cardiologist at [facility].  Continue supportive care at this point.  She does not appear volume overloaded by echo measurement or physical exam.      For questions or updates, please contact [place] Please consult [email or web address] for contact info under     Signed, Larae Grooms, MD  07/15/2018 5:49 PM

## 2018-07-15 NOTE — Progress Notes (Signed)
Patient arrived via bed to be cardioverted.  HR was in the 140's.  Report received at bedside.  Dr. Hyacinth Meeker accompanied patient.  Anesthesia called and shortly arrived at bedside.  Patient placed on monitor and crash cart, sedation given by anesthesia.  Patient was shocked and converted.  Anesthesia remained at bedside.  Monitoring.

## 2018-07-15 NOTE — Progress Notes (Signed)
Pt urine output decreased. CCM notified. Discuss pt CVP (13), mucus membranes being dry, and Blood pressure status. Pt bladder scanned and 20-84ml present. Pt resting comfortably in bed. Will continue to monitor. No new orders at this time.

## 2018-07-15 NOTE — Significant Event (Signed)
Called to patient bedside for SVT with HR of 180-190. Given 5 mg Metoprolol (with some improvement), followed by 2.5 mg, HR 140-150. NA 156. LA 5.2. ABG 7.322/30/62.2. Bicarb 9.8. Given 50 meq Bicarb. Patient appears significantly  dehydrated with low urinary output. Given 500 ml LR and Albumin. Given Continued SVT and Hypotension PCCM Consulted.

## 2018-07-15 NOTE — Significant Event (Signed)
Rapid Response Event Note  Overview: Time Called: 0359 Arrival Time: 0410 Event Type: Cardiac, Hypotension  Initial Focused Assessment: Called by nursing staff for HR 160s SVT. Upon arrival, pt was drowsy, arousable to verbal stimuli, confused/disoriented x3. Not in distress. HR 150-160 SVT with manual BPs consistently 80s/60s. Little to no urine output. Jovita Kussmaul NP at bedside. PCCM came to bedside as well.  Plans for urgent cardioversion after transfer to ICU.  Interventions: -Lopressor per primary team pta -LR bolus 500cc -Albumin 250 cc    Event Summary: Name of Physician Notified: Jovita Kussmaul NP came to bedside and PCCM Dr. Hyacinth Meeker    at    Outcome: Transferred (Comment)  Event End Time: 5366  Rose Fillers

## 2018-07-15 NOTE — Progress Notes (Addendum)
Patient cont. To use abdo.. Muscles to breath with wheezing. o2 at 5l/ n.c. o2 sats would drop upper 80's inc o2 to 6l/m n.c. Patient would drop at times to upper 80's . Resp. Ther. On floor and ask to assess patient will try H.F. o2 . Heart rate S.T. 120's sometimes when patient would move around in bed her H.R. would briefly go up 140's and right back down. Patient voiced no complaints. See V.S. slow sheet .

## 2018-07-15 NOTE — Progress Notes (Signed)
Nutrition Follow-up  INTERVENTION:   Initiate TF via NG tube: - Vital AF 1.2 @ 35 ml/hr (840 ml/day) - Pro-stat 30 ml BID - Free water per MD  Tube feeding regimen provides 1208 kcal, 93 grams of protein, and 681 ml of H2O (105% of kcal needs, 100% of protein needs).  NUTRITION DIAGNOSIS:   Inadequate oral intake related to lethargy/confusion as evidenced by NPO status.  Ongoing  GOAL:   Patient will meet greater than or equal to 90% of their needs  Met via TF  MONITOR:   Diet advancement, Labs, Weight trends, Skin, I & O's  REASON FOR ASSESSMENT:   Ventilator, Consult Enteral/tube feeding initiation and management  ASSESSMENT:   Alyssa Mckenzie is a 68 y.o. female with medical history significant of dCHF, COPD, GERD, liver cirrhosis, CKD 3, alcohol use, anemia, who presents with generalized weakness and fatigue.  Pt admitted with ARF on CKD 3.  2/9 - transferred to PCU, NGT placed 2/12 - required cardioversion with sedation, intubated  Spoke with critical care MD who approved verbal with readback consult to dietitian to initiate enteral nutrition. Discussed pt with RN.  Spoke with pt's family at bedside who share that pt has had a decreased appetite over the last month. Per pt's family member, she has never had a big appetite.  Pt's family confirm that pt has been losing weight recently. Pt with 9 lb weight gain since admission; however pt is +4.3 L. Suspect dry weight is 114.7 lbs (52 kg). Will utilize this weight to estimate nutritional needsGiven dry weight and per weight history in chart, pt with 8.7 kg weight loss since 04/21/18. This is a 14.3% weight loss in 3 months which is significant for timeframe.  Patient is currently intubated on ventilator support MV: 6.5 L/min Temp (24hrs), Avg:98.1 F (36.7 C), Min:97.3 F (36.3 C), Max:98.9 F (37.2 C)  Propofol: none  Medications reviewed and include: folic acid, lactulose, Protonix, thiamine, D5 at 50 ml/hr  (provides 204 kcal daily), free water 250 ml q 6 hours  Labs reviewed: sodium 156 (H), BUN 30 (H), creatinine 1.16 (H), elevated LFTs  UOP: 815 ml x 24 hours Rectal tube: 1200 ml + 1 unmeasured occurrence x 24 hours I/O's: +4.3 L since admit  Diet Order:   Diet Order            Diet NPO time specified  Diet effective now              EDUCATION NEEDS:   Not appropriate for education at this time  Skin:  Skin Assessment: Reviewed RN Assessment  Last BM:  2/12 (rectal tube)  Height:   Ht Readings from Last 1 Encounters:  07/28/2018 5' (1.524 m)    Weight:   Wt Readings from Last 1 Encounters:  07/15/18 55.9 kg    Ideal Body Weight:  45.5 kg  BMI:  Body mass index is 24.07 kg/m.  Estimated Nutritional Needs:   Kcal:  1149  Protein:  85-100 grams  Fluid:  per MD    Gaynell Face, MS, RD, LDN Inpatient Clinical Dietitian Pager: [phone] Weekend/After Hours: [phone]

## 2018-07-15 NOTE — Progress Notes (Signed)
Progress Note   Subjective  Chief Complaint: Decompensated cirrhosis with hepatic encephalopathy  Today, patient was found in the ICU intubated and sedated.  Apparently she had refractory SVT overnight with acute hypoxemic respiratory insufficiency.   Objective   Vital signs in last 24 hours: Temp:  [97.3 F (36.3 C)-98.9 F (37.2 C)] 98.3 F (36.8 C) (02/12 1216) Pulse Rate:  [29-148] 106 (02/12 1300) Resp:  [11-34] 20 (02/12 1300) BP: (76-138)/(50-85) 123/66 (02/12 1300) SpO2:  [78 %-100 %] 96 % (02/12 1300) FiO2 (%):  [50 %-100 %] 50 % (02/12 1138) Weight:  [55.9 kg] 55.9 kg (02/12 0340) Last BM Date: 07/14/18 General:   Frail, chronically ill, white female in NAD Heart:  Regular rate and rhythm; no murmurs Lungs: Intubated and sedated Abdomen:  Soft, nondistended. Normal bowel sounds. Extremities:  Without edema.+ Multiple ecchymosis Psych: Sedated  Intake/Output from previous day: 02/11 0701 - 02/12 0700 In: 1550 [NG/GT:550; IV Piggyback:1000] Out: 2015 [Urine:815; Stool:1200] Intake/Output this shift: Total I/O In: 268.8 [I.V.:268.8] Out: 290 [Urine:40; Stool:250]  Lab Results: Recent Labs    07/13/18 0410 07/14/18 0337 07/15/18 0518 07/15/18 0932  WBC 8.8 9.7 12.9*  --   HGB 8.9* 9.9* 10.1* 8.5*  HCT 26.4* 29.7* 32.1* 25.0*  PLT 175 156 191  --    BMET Recent Labs    07/14/18 0337 07/15/18 0319 07/15/18 0932 07/15/18 0939  NA 153* 156* 158* 156*  K 3.5 4.3 4.6 4.9  CL 125* 129*  --  127*  CO2 18* 21*  --  17*  GLUCOSE 122* 115*  --  114*  BUN 34* 30*  --  31*  CREATININE 1.24* 1.16*  --  1.32*  CALCIUM 8.9 9.3  --  9.3   LFT Recent Labs    07/15/18 0319  PROT 6.1*  ALBUMIN 2.6*  AST 189*  ALT 75*  ALKPHOS 165*  BILITOT 2.8*   PT/INR Recent Labs    07/15/18 0319  LABPROT 22.2*  INR 1.98    Studies/Results: Dg Abd 1 View  Result Date: 07/14/2018 CLINICAL DATA:  Check gastric catheter placement EXAM: ABDOMEN - 1 VIEW  COMPARISON:  07/12/2018 FINDINGS: Scattered large and small bowel gas is noted. Gastric catheter is noted within the stomach. Proximal side-port is noted at the gastroesophageal junction stable from the previous exam. The catheter could be advanced as clinically indicated. No acute bony abnormality is noted. Patchy infiltrative changes are noted in the bases bilaterally. IMPRESSION: Gastric catheter within the stomach although the proximal side port lies at the gastroesophageal junction. This could be advanced several cm as clinically indicated. Electronically Signed   By: Alcide Clever M.D.   On: 07/14/2018 18:58   Dg Chest Port 1 View  Result Date: 07/15/2018 CLINICAL DATA:  Central line placement. EXAM: PORTABLE CHEST 1 VIEW 8:59 a.m. COMPARISON:  07/15/2018 at 6:39 a.m. and 07/14/2018 and 07/11/2018 FINDINGS: Endotracheal tube is in good position 3.7 cm above the carina. Tip of the central venous catheter is in the superior vena cava at the level of the carina. NG tube tip is below the diaphragm. There is diffuse accentuation of the interstitial markings in the left lung as well as prominent interstitial accentuation at the right lung base with sparing of the right upper lung zone. This is unchanged since earlier on the same date. I suspect this represents interstitial pulmonary edema. There is increased atelectasis in the left upper lobe medially as compared to the study of 07/11/2018. No  discrete effusions.  No acute bone abnormality. Aortic atherosclerosis. IMPRESSION: 1. Endotracheal tube and central line appear in good position. 2. Persistent accentuation of the interstitial markings bilaterally consistent with interstitial edema. Electronically Signed   By: Francene Boyers M.D.   On: 07/15/2018 09:32   Dg Chest Port 1 View  Result Date: 07/15/2018 CLINICAL DATA:  Acute respiratory failure. EXAM: PORTABLE CHEST 1 VIEW COMPARISON:  Earlier today FINDINGS: Apical emphysematous appearance. Generalized  interstitial airspace opacity with mild left-sided asymmetry. Normal heart size and mediastinal contours. Naso enteric tube at least reaches the diaphragm. Defibrillator pad present. IMPRESSION: 1. Unchanged interstitial opacity favoring edema. 2. Emphysema. Electronically Signed   By: Marnee Spring M.D.   On: 07/15/2018 07:36   Dg Chest Port 1 View  Result Date: 07/14/2018 CLINICAL DATA:  hypoxia EXAM: PORTABLE CHEST - 1 VIEW COMPARISON:  07/11/2018 FINDINGS: Worsening bibasilar edema or infiltrates left greater than right. Attenuated bronchovascular markings in the right upper lung. Heart size and mediastinal contours are within normal limits. No definite effusion. No pneumothorax. Gastric tube extends to the stomach. Visualized bones unremarkable. IMPRESSION: Worsening bibasilar infiltrates or edema, left greater than right. Electronically Signed   By: Corlis Leak M.D.   On: 07/14/2018 14:47    Assessment / Plan:   Assessment: 1.  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  Anemia of chronic disease 3.  Refractory SVT: Patient transferred to the ICU overnight status post cardioversion, emergent intubation with full vent support given acute hypoxemic respiratory insufficiency  Plan: 1.  No change to current recommendations from GI standpoint. 2.  We will sign off for now, please call us back if we can be of any further assistance.  Thank you for this kind consultation.   LOS: 2 days   Unk Lightning  07/15/2018, 2:50 PM

## 2018-07-16 ENCOUNTER — Inpatient Hospital Stay (HOSPITAL_COMMUNITY): Payer: Managed Care, Other (non HMO)

## 2018-07-16 DIAGNOSIS — K7030 Alcoholic cirrhosis of liver without ascites: Secondary | ICD-10-CM

## 2018-07-16 DIAGNOSIS — J9621 Acute and chronic respiratory failure with hypoxia: Secondary | ICD-10-CM

## 2018-07-16 DIAGNOSIS — N179 Acute kidney failure, unspecified: Secondary | ICD-10-CM

## 2018-07-16 DIAGNOSIS — N183 Chronic kidney disease, stage 3 (moderate): Secondary | ICD-10-CM

## 2018-07-16 DIAGNOSIS — I82409 Acute embolism and thrombosis of unspecified deep veins of unspecified lower extremity: Secondary | ICD-10-CM

## 2018-07-16 LAB — COMPREHENSIVE METABOLIC PANEL
ALBUMIN: 2.6 g/dL — AB (ref 3.5–5.0)
ALT: 76 U/L — ABNORMAL HIGH (ref 0–44)
AST: 228 U/L — ABNORMAL HIGH (ref 15–41)
Alkaline Phosphatase: 147 U/L — ABNORMAL HIGH (ref 38–126)
Anion gap: 9 (ref 5–15)
BUN: 39 mg/dL — ABNORMAL HIGH (ref 8–23)
CHLORIDE: 123 mmol/L — AB (ref 98–111)
CO2: 19 mmol/L — ABNORMAL LOW (ref 22–32)
Calcium: 9 mg/dL (ref 8.9–10.3)
Creatinine, Ser: 1.69 mg/dL — ABNORMAL HIGH (ref 0.44–1.00)
GFR calc Af Amer: 36 mL/min — ABNORMAL LOW (ref >60)
GFR calc non Af Amer: 31 mL/min — ABNORMAL LOW (ref >60)
Glucose, Bld: 139 mg/dL — ABNORMAL HIGH (ref 70–99)
Potassium: 3.9 mmol/L (ref 3.5–5.1)
Sodium: 151 mmol/L — ABNORMAL HIGH (ref 135–145)
Total Bilirubin: 2.5 mg/dL — ABNORMAL HIGH (ref 0.3–1.2)
Total Protein: 5.7 g/dL — ABNORMAL LOW (ref 6.5–8.1)

## 2018-07-16 LAB — URINE CULTURE

## 2018-07-16 LAB — CBC WITH DIFFERENTIAL/PLATELET
Abs Immature Granulocytes: 0.06 10*3/uL (ref 0.00–0.07)
Basophils Absolute: 0.1 10*3/uL (ref 0.0–0.1)
Basophils Relative: 1 %
EOS ABS: 0.8 10*3/uL — AB (ref 0.0–0.5)
EOS PCT: 9 %
HEMATOCRIT: 28.4 % — AB (ref 36.0–46.0)
Hemoglobin: 8.9 g/dL — ABNORMAL LOW (ref 12.0–15.0)
Immature Granulocytes: 1 %
Lymphocytes Relative: 12 %
Lymphs Abs: 1.1 10*3/uL (ref 0.7–4.0)
MCH: 30.8 pg (ref 26.0–34.0)
MCHC: 31.3 g/dL (ref 30.0–36.0)
MCV: 98.3 fL (ref 80.0–100.0)
Monocytes Absolute: 0.6 10*3/uL (ref 0.1–1.0)
Monocytes Relative: 6 %
Neutro Abs: 6.8 10*3/uL (ref 1.7–7.7)
Neutrophils Relative %: 71 %
Platelets: 133 10*3/uL — ABNORMAL LOW (ref 150–400)
RBC: 2.89 MIL/uL — ABNORMAL LOW (ref 3.87–5.11)
RDW: 21.5 % — ABNORMAL HIGH (ref 11.5–15.5)
WBC: 9.3 10*3/uL (ref 4.0–10.5)
nRBC: 1 % — ABNORMAL HIGH (ref 0.0–0.2)

## 2018-07-16 LAB — POCT I-STAT 7, (LYTES, BLD GAS, ICA,H+H)
Acid-base deficit: 8 mmol/L — ABNORMAL HIGH (ref 0.0–2.0)
Bicarbonate: 19.4 mmol/L — ABNORMAL LOW (ref 20.0–28.0)
CALCIUM ION: 1.43 mmol/L — AB (ref 1.15–1.40)
HCT: 24 % — ABNORMAL LOW (ref 36.0–46.0)
Hemoglobin: 8.2 g/dL — ABNORMAL LOW (ref 12.0–15.0)
O2 Saturation: 93 %
PCO2 ART: 48.5 mmHg — AB (ref 32.0–48.0)
Patient temperature: 98.8
Potassium: 3.9 mmol/L (ref 3.5–5.1)
Sodium: 154 mmol/L — ABNORMAL HIGH (ref 135–145)
TCO2: 21 mmol/L — ABNORMAL LOW (ref 22–32)
pH, Arterial: 7.211 — ABNORMAL LOW (ref 7.350–7.450)
pO2, Arterial: 81 mmHg — ABNORMAL LOW (ref 83.0–108.0)

## 2018-07-16 LAB — BASIC METABOLIC PANEL
Anion gap: 2 — ABNORMAL LOW (ref 5–15)
BUN: 43 mg/dL — ABNORMAL HIGH (ref 8–23)
CO2: 23 mmol/L (ref 22–32)
Calcium: 8.5 mg/dL — ABNORMAL LOW (ref 8.9–10.3)
Chloride: 125 mmol/L — ABNORMAL HIGH (ref 98–111)
Creatinine, Ser: 1.62 mg/dL — ABNORMAL HIGH (ref 0.44–1.00)
GFR calc Af Amer: 38 mL/min — ABNORMAL LOW (ref >60)
GFR calc non Af Amer: 33 mL/min — ABNORMAL LOW (ref >60)
GLUCOSE: 179 mg/dL — AB (ref 70–99)
Potassium: 4.1 mmol/L (ref 3.5–5.1)
Sodium: 150 mmol/L — ABNORMAL HIGH (ref 135–145)

## 2018-07-16 LAB — LACTIC ACID, PLASMA: Lactic Acid, Venous: 2 mmol/L (ref 0.5–1.9)

## 2018-07-16 LAB — GLUCOSE, CAPILLARY
Glucose-Capillary: 114 mg/dL — ABNORMAL HIGH (ref 70–99)
Glucose-Capillary: 124 mg/dL — ABNORMAL HIGH (ref 70–99)
Glucose-Capillary: 128 mg/dL — ABNORMAL HIGH (ref 70–99)
Glucose-Capillary: 135 mg/dL — ABNORMAL HIGH (ref 70–99)
Glucose-Capillary: 157 mg/dL — ABNORMAL HIGH (ref 70–99)
Glucose-Capillary: 161 mg/dL — ABNORMAL HIGH (ref 70–99)

## 2018-07-16 LAB — AMMONIA: Ammonia: 192 umol/L — ABNORMAL HIGH (ref 9–35)

## 2018-07-16 LAB — PHOSPHORUS: Phosphorus: 1.6 mg/dL — ABNORMAL LOW (ref 2.5–4.6)

## 2018-07-16 LAB — MAGNESIUM
Magnesium: 1.6 mg/dL — ABNORMAL LOW (ref 1.7–2.4)
Magnesium: 2.1 mg/dL (ref 1.7–2.4)

## 2018-07-16 MED ORDER — LACTULOSE ENEMA
300.0000 mL | Freq: Once | ORAL | Status: AC
  Administered 2018-07-16: 300 mL via RECTAL
  Filled 2018-07-16: qty 300

## 2018-07-16 MED ORDER — SODIUM PHOSPHATES 45 MMOLE/15ML IV SOLN
30.0000 mmol | Freq: Once | INTRAVENOUS | Status: AC
  Administered 2018-07-16: 30 mmol via INTRAVENOUS
  Filled 2018-07-16: qty 10

## 2018-07-16 MED ORDER — MAGNESIUM SULFATE 2 GM/50ML IV SOLN: 2.0000 g | Freq: Once | INTRAVENOUS | Status: AC

## 2018-07-16 MED ORDER — DEXTROSE 5 % IV SOLN
INTRAVENOUS | Status: DC
  Administered 2018-07-16 – 2018-07-18 (×4): via INTRAVENOUS

## 2018-07-16 MED ORDER — MAGNESIUM SULFATE 2 GM/50ML IV SOLN
2.0000 g | Freq: Once | INTRAVENOUS | Status: AC
  Administered 2018-07-16: 2 g via INTRAVENOUS
  Filled 2018-07-16: qty 50

## 2018-07-16 MED ORDER — SODIUM BICARBONATE 8.4 % IV SOLN
INTRAVENOUS | Status: DC
  Filled 2018-07-16: qty 150

## 2018-07-16 NOTE — Progress Notes (Signed)
Pt's heart rate went up in the 140's, cardiology notified. EKG obtained. Cardiologist Electa Sniff) came to bedside to see pt, and personally review EKG. New orders to give 6mg  of adenosine were given. Pt with Zoll pads on, connected to zoll, with code cart with in reach. 6 mg of Adenosine IV was given and pt's heart rate was lowered. New orders to increase metoprolol to 25 mg BID were placed as well. RN will continue to monitor pt closely.

## 2018-07-16 NOTE — Progress Notes (Signed)
EEG Completed; Results Pending  

## 2018-07-16 NOTE — Progress Notes (Signed)
Bilateral lower extremity venous duplex has been completed. Preliminary results can be found in CV Proc through chart review.   07/16/18 2:09 PM Olen Cordial RVT

## 2018-07-16 NOTE — Procedures (Signed)
History: 68yo F with AMS after SVT, also with multiple metabolic abnormalitis.   Sedation: None  Technique: This is a 21 channel routine scalp EEG performed at the bedside with bipolar and monopolar montages arranged in accordance to the international 10/20 system of electrode placement. One channel was dedicated to EKG recording.    Background: The background consists of generalized irregular delta and theta activities.  In addition, there are occasional bifrontally predominant discharges with triphasic morphology, as well as runs of frontally predominant intermittent rhythmic delta activity(FIRDA).  Photic stimulation: Physiologic driving is not performed  EEG Abnormalities: 1) triphasic waves 2) FIRDA 3) irregular delta activity 4) absent posterior dominant rhythm  Clinical Interpretation: This EEG is consistent with a severe generalized nonspecific cerebral dysfunction (encephalopathy).  The presence of triphasic waves, while not definite, is suggestive of toxic/metabolic etiology.   There was no seizure or seizure predisposition recorded on this study. Please note that lack of epileptiform activity on EEG does not preclude the possibility of epilepsy.   Ritta Slot, MD [facility] [phone]  If 7pm- 7am, please page neurology on call as listed in AMION.

## 2018-07-16 NOTE — Progress Notes (Signed)
PT Cancellation Note  Patient Details Name: Alyssa Mckenzie MRN: [id number] DOB: 1950-09-24   Cancelled Treatment:    Reason Eval/Treat Not Completed: Medical issues which prohibited therapy. Pt now intubated.    [name] [facility] 07/16/2018, 9:59 AM Skip Mayer PT Acute Rehabilitation Services Pager [phone] Office [phone]

## 2018-07-16 NOTE — Progress Notes (Addendum)
NAME:  Alyssa Mckenzie, MRN:  [id number], DOB:  [birth year], LOS: 3 ADMISSION DATE:  07/22/2018, CONSULTATION DATE: 07/15/2018 REFERRING MD: [facility], CHIEF COMPLAINT: Supraventricular tachycardia unstable  Brief History   68 year old failure to thrive patient with multiple medical comorbidities developed supraventricular tachycardia required cardioversion with sedation 07/15/2018  History of present illness   68 year old female who has hepatic cirrhosis with hepatic encephalopathy who is admitted for weakness nausea evaluated by renal for increasing creatinine and GI services for hepatic encephalopathy.  She was in her normal state of poor health on 07/15/2018 and required transfer to intensive care unit for refractory supraventricular cardia.  Blockade was attempted IV without success.  Anesthesia was activated she was given etomidate and cardioverted successfully to a sinus tach of 107.  She has a known elevated ammonia level and is on lactulose and rifampin.  She has hypernatremia for which she has been on free water although now her NG tube is out.  He is a full code pulmonary critical care is been asked to serve her care at this time.  There is a good chance she require intubation for the days over.  Past Medical History  Hepatic encephalopathy Renal failure Failure to thrive Esophageal stricture Former tobacco abuse 2H abuse  Significant Hospital Events   07/15/2018 developed supraventricular cardia required cardioversion  Consults:  Renal GI Pulmonary critical care  Procedures:  07/15/2018 0 654 cardioversion version for supra ventricular tachycardia ETT 2/12 >> Left IJ CVC 2/12 >>   Significant Diagnostic Tests:  07/15/2018 2D echo>>  Micro Data:  Blood 2/13 >> Urine 2/13 >> Tracheal Asp 2/13 >>   Antimicrobials:    Interim history/subjective:  Overnight with SVT requiring Adenosine   Objective   Blood pressure 128/68, pulse (!) 103, temperature 98.8 F (37.1 C),  temperature source Oral, resp. rate 17, height 5' (1.524 m), weight 58.1 kg, SpO2 93 %. CVP:  [9 mmHg-15 mmHg] 9 mmHg  Vent Mode: PSV;CPAP FiO2 (%):  [50 %] 50 % Set Rate:  [16 bmp] 16 bmp Vt Set:  [370 mL] 370 mL PEEP:  [5 cmH20] 5 cmH20 Pressure Support:  [10 cmH20] 10 cmH20 Plateau Pressure:  [11 cmH20-15 cmH20] 11 cmH20   Intake/Output Summary (Last 24 hours) at 07/16/2018 1202 Last data filed at 07/16/2018 0900 Gross per 24 hour  Intake 1600.35 ml  Output 1765 ml  Net -164.65 ml   Filed Weights   07/14/18 0642 07/15/18 0340 07/16/18 0500  Weight: 58.9 kg 55.9 kg 58.1 kg    Examination: General: Chronically ill appearing adult female  HENT: ETT in place Lungs: Clear breath sounds, no wheeze/crackles   Cardiovascular: Tachy, no MRG  Abdomen: Soft, active bowel sounds  Extremities: Mild edema, multiple areas of ecchymosis covering body Neuro: Obtunded, does not follow commands   Resolved Hospital Problem list     Assessment & Plan:   Encephalopathy, multifactorial in setting of uremia and hepatic  Cirrhosis  Plan  -Obtain CT Head  -Trend Ammonia  -Avoid all sedating medications   Supraventricular tachycardia requiring s/p cardioversion 2/12 AM and Adenosine 2/12 pm -ECHO with reduced systolic function with RV enlargement  Plan   Cardiac monitor Continue Metoprolol  Cardiology Following   Acute Hypoxic/Hypercarbic Respiratory Failure Plan  Vent Support, no wean due to poor neuro status > Place on Rate due to ABG (7.2/48/81)  Trend ABG/CXR Pulmonary Hygiene   Acute kidney injury Hypernatremia  Plan  Trend BMP Increase Dextrose to 75 ml/hr, Free water at 250  ml q6h  Decompensated cirrhosis with hepatic encephalopathy evaluated Plan GI Signed off 2/12 Trend Ammonia  Continue rifampin and lactulose PPI   Best practice:  Diet: NPO VAP protocol (if indicated) DVT prophylaxis: Elevated INR from hepatic failure GI prophylaxis: PPI Glucose control:  Scale insulin protocol Mobility: Bedrest Code Status full Family Communication: Family updated on plan 2/13. Per HCPOA patient would only want short term intubation. No trach/peg or CPR. Code Status updated on EMR   Labs   CBC: Recent Labs  Lab 07-19-2018 1739  07/12/18 0450 07/13/18 0410 07/14/18 [id number] 07/15/18 0518 07/15/18 0932 07/16/18 0428 07/16/18 1156  WBC 5.5   < > 6.7 8.8 9.7 12.9*  --  9.3  --   NEUTROABS 4.4  --  6.0 7.8*  --   --   --  6.8  --   HGB 9.6*   < > 9.2* 8.9* 9.9* 10.1* 8.5* 8.9* 8.2*  HCT 29.3*   < > 28.1* 26.4* 29.7* 32.1* 25.0* 28.4* 24.0*  MCV 91.8   < > 94.3 92.6 90.8 95.3  --  98.3  --   PLT 178   < > 173 175 156 191  --  133*  --    < > = values in this interval not displayed.    Basic Metabolic Panel: Recent Labs  Lab 07-19-2018 1749  07/13/18 0410 07/14/18 [id number] 07/15/18 0319 07/15/18 0932 07/15/18 0939 07/16/18 0659 07/16/18 1156  NA  --    < > 148* 153* 156* 158* 156* 151* 154*  K  --    < > 3.0* 3.5 4.3 4.6 4.9 3.9 3.9  CL  --    < > 123* 125* 129*  --  127* 123*  --   CO2  --    < > 16* 18* 21*  --  17* 19*  --   GLUCOSE  --    < > 144* 122* 115*  --  114* 139*  --   BUN  --    < > 38* 34* 30*  --  31* 39*  --   CREATININE  --    < > 1.88* 1.24* 1.16*  --  1.32* 1.69*  --   CALCIUM  --    < > 8.3* 8.9 9.3  --  9.3 9.0  --   MG 2.2  --  1.8 1.8 1.7  --   --  1.6*  --   PHOS  --   --   --   --   --   --   --  1.6*  --    < > = values in this interval not displayed.   GFR: Estimated Creatinine Clearance: 26.1 mL/min (A) (by C-G formula based on SCr of 1.69 mg/dL (H)). Recent Labs  Lab 07-19-2018 1739  07/13/18 0410 07/14/18 0337 07/15/18 0518 07/15/18 0939 07/16/18 0428  WBC 5.5   < > 8.8 9.7 12.9*  --  9.3  LATICACIDVEN 1.7  --   --   --  5.2* 4.2*  --    < > = values in this interval not displayed.    Liver Function Tests: Recent Labs  Lab 07-19-2018 1739 07/13/18 0410 07/14/18 0337 07/15/18 0319 07/16/18 0659  AST 110*  211* 198* 189* 228*  ALT 43 66* 71* 75* 76*  ALKPHOS 145* 136* 152* 165* 147*  BILITOT 2.2* 2.4* 2.4* 2.8* 2.5*  PROT 6.5 5.8* 6.1* 6.1* 5.7*  ALBUMIN 2.9* 2.4* 2.5* 2.6* 2.6*   No  results for input(s): LIPASE, AMYLASE in the last 168 hours. Recent Labs  Lab 07/04/2018 1739 07/11/18 1247 07/12/18 0450 07/13/18 0410 07/15/18 0518  AMMONIA 85* 66* 83* 184* 79*    ABG    Component Value Date/Time   PHART 7.211 (L) 07/16/2018 1156   PCO2ART 48.5 (H) 07/16/2018 1156   PO2ART 81.0 (L) 07/16/2018 1156   HCO3 19.4 (L) 07/16/2018 1156   TCO2 21 (L) 07/16/2018 1156   ACIDBASEDEF 8.0 (H) 07/16/2018 1156   O2SAT 93.0 07/16/2018 1156     Coagulation Profile: Recent Labs  Lab 07/11/18 0002 07/15/18 0319  INR 1.46 1.98    Cardiac Enzymes: Recent Labs  Lab 07/21/2018 1739 07/11/18 0002 07/11/18 0612 07/11/18 1247 07/15/18 0939  TROPONINI 0.04* 0.03* 0.04* 0.04* 0.10*    HbA1C: Hgb A1c MFr Bld  Date/Time Value Ref Range Status  07/11/2018 12:04 AM 5.2 4.8 - 5.6 % Final    Comment:    (NOTE) Pre diabetes:          5.7%-6.4% Diabetes:              >6.4% Glycemic control for   <7.0% adults with diabetes     CBG: Recent Labs  Lab 07/15/18 2006 07/16/18 0012 07/16/18 0440 07/16/18 0759 07/16/18 1124  GLUCAP 113* 114* 124* 135* 128*    Review of Systems:   Not available secondary to altered mental status  Past Medical History  She,  has a past medical history of CHF (congestive heart failure) (HCC), COPD (chronic obstructive pulmonary disease) (HCC), and Liver failure (HCC).   Surgical History    Past Surgical History:  Procedure Laterality Date  . ABDOMINAL HYSTERECTOMY    . BLADDER SURGERY    . BREAST SURGERY    . CHOLECYSTECTOMY    . TONSILLECTOMY       Social History   reports that she has quit smoking. She has never used smokeless tobacco. She reports current alcohol use of about 4.0 standard drinks of alcohol per week. She reports that she does  not use drugs.   Family History   Her family history includes Congestive Heart Failure in her father.   Allergies Allergies  Allergen Reactions  . Statins Other (See Comments)    aching     Home Medications  Prior to Admission medications   Medication Sig Start Date End Date Taking? Authorizing Provider  albuterol (PROVENTIL HFA;VENTOLIN HFA) 108 (90 Base) MCG/ACT inhaler Inhale into the lungs every 6 (six) hours as needed for wheezing or shortness of breath.   Yes [provider]  Cholecalciferol (VITAMIN D3) 125 MCG (5000 UT) TABS Take 1 tablet by mouth daily.   Yes [provider]  fluticasone-salmeterol (ADVAIR HFA) 115-21 MCG/ACT inhaler Inhale 2 puffs into the lungs 2 (two) times daily.   Yes [provider]  furosemide (LASIX) 20 MG tablet Take 20 mg by mouth daily.   Yes [provider]  gabapentin (NEURONTIN) 400 MG capsule Take 800 mg by mouth 3 (three) times daily. 04/11/18  Yes [provider]  hydrOXYzine (ATARAX/VISTARIL) 50 MG tablet Take 1 tablet (50 mg total) by mouth every 4 (four) hours as needed for itching. 04/23/18  Yes Marinda Elk, MD  ibuprofen (ADVIL,MOTRIN) 200 MG tablet Take 400 mg by mouth at bedtime as needed for moderate pain.   Yes [provider]  metoprolol tartrate (LOPRESSOR) 25 MG tablet Take 25 mg by mouth daily.   Yes [provider]  omeprazole (PRILOSEC)  40 MG capsule Take 40 mg by mouth daily. 04/17/17  Yes [provider]  ondansetron (ZOFRAN-ODT) 4 MG disintegrating tablet Take 4 mg by mouth every 6 (six) hours as needed for nausea.  07/09/18  Yes [provider]  triamcinolone cream (KENALOG) 0.1 % Apply 1 application topically daily as needed for itching. 04/16/18  Yes [provider]  TURMERIC PO Take 2 tablets by mouth daily.   Yes [provider]  umeclidinium bromide (INCRUSE ELLIPTA) 62.5 MCG/INH AEPB Inhale 1 puff into the lungs daily.    Yes [provider]  predniSONE (DELTASONE) 20 MG tablet Take by mouth daily. Taper 04/16/18   [provider]     Critical care time: 42 min    Jovita Kussmaul, AGACNP-BC [facility]  Pgr: [phone]  PCCM Pgr: [phone]  Attending Note:  68 year old female with PMH of etoh abuse who presents to PCCM from the floors with a-fib with RVR requiring cardioversion and subsequently intubated for respiratory failure.  This AM, she is completely unresponsive on exam, with coarse BS diffusely.  I reviewed CXR myself, ETT is in a good position.  Patient is obtunded this AM.  Will check head CT.  Continue lactulose and rifampin.  Will order an EEG.  D/C all sedation.  Hold off weaning given mental status.  Continue full vent support.  Check ABG now to insure lack of hypercarbia as a cause.  Check CBGs.  Replace all electrolytes.  Appreciate input from cardiology.  If all above is negative will consider a consultation with neurology in AM.  The patient is critically ill with multiple organ systems failure and requires high complexity decision making for assessment and support, frequent evaluation and titration of therapies, application of advanced monitoring technologies and extensive interpretation of multiple databases.   Critical Care Time devoted to patient care services described in this note is  36  Minutes. This time reflects time of care of this signee Dr Koren Bound. This critical care time does not reflect procedure time, or teaching time or supervisory time of PA/NP/Med student/Med Resident etc but could involve care discussion time.  Alyson Reedy, M.D. [facility]/Critical Care Medicine. Pager: [phone]. After hours pager: [phone].

## 2018-07-17 ENCOUNTER — Inpatient Hospital Stay (HOSPITAL_COMMUNITY): Payer: Managed Care, Other (non HMO)

## 2018-07-17 DIAGNOSIS — K7290 Hepatic failure, unspecified without coma: Secondary | ICD-10-CM

## 2018-07-17 LAB — SODIUM
Sodium: 140 mmol/L (ref 135–145)
Sodium: 149 mmol/L — ABNORMAL HIGH (ref 135–145)
Sodium: 146 mmol/L — ABNORMAL HIGH (ref 135–145)

## 2018-07-17 LAB — POCT I-STAT 7, (LYTES, BLD GAS, ICA,H+H)
Acid-base deficit: 7 mmol/L — ABNORMAL HIGH (ref 0.0–2.0)
Bicarbonate: 19.1 mmol/L — ABNORMAL LOW (ref 20.0–28.0)
Calcium, Ion: 1.4 mmol/L (ref 1.15–1.40)
HCT: 27 % — ABNORMAL LOW (ref 36.0–46.0)
Hemoglobin: 9.2 g/dL — ABNORMAL LOW (ref 12.0–15.0)
O2 Saturation: 95 %
PH ART: 7.289 — AB (ref 7.350–7.450)
Potassium: 3.4 mmol/L — ABNORMAL LOW (ref 3.5–5.1)
Sodium: 150 mmol/L — ABNORMAL HIGH (ref 135–145)
TCO2: 20 mmol/L — ABNORMAL LOW (ref 22–32)
pCO2 arterial: 39.5 mmHg (ref 32.0–48.0)
pO2, Arterial: 82 mmHg — ABNORMAL LOW (ref 83.0–108.0)

## 2018-07-17 LAB — APTT: aPTT: 40 seconds — ABNORMAL HIGH (ref 24–36)

## 2018-07-17 LAB — GLUCOSE, CAPILLARY
Glucose-Capillary: 121 mg/dL — ABNORMAL HIGH (ref 70–99)
Glucose-Capillary: 147 mg/dL — ABNORMAL HIGH (ref 70–99)
Glucose-Capillary: 159 mg/dL — ABNORMAL HIGH (ref 70–99)
Glucose-Capillary: 159 mg/dL — ABNORMAL HIGH (ref 70–99)
Glucose-Capillary: 160 mg/dL — ABNORMAL HIGH (ref 70–99)
Glucose-Capillary: 180 mg/dL — ABNORMAL HIGH (ref 70–99)

## 2018-07-17 LAB — CBC
HCT: 28.2 % — ABNORMAL LOW (ref 36.0–46.0)
Hemoglobin: 8.8 g/dL — ABNORMAL LOW (ref 12.0–15.0)
MCH: 30.8 pg (ref 26.0–34.0)
MCHC: 31.2 g/dL (ref 30.0–36.0)
MCV: 98.6 fL (ref 80.0–100.0)
NRBC: 1.2 % — AB (ref 0.0–0.2)
Platelets: UNDETERMINED 10*3/uL (ref 150–400)
RBC: 2.86 MIL/uL — ABNORMAL LOW (ref 3.87–5.11)
RDW: 22.2 % — ABNORMAL HIGH (ref 11.5–15.5)
WBC: 7.3 10*3/uL (ref 4.0–10.5)

## 2018-07-17 LAB — BASIC METABOLIC PANEL
Anion gap: 6 (ref 5–15)
BUN: 44 mg/dL — ABNORMAL HIGH (ref 8–23)
CO2: 20 mmol/L — ABNORMAL LOW (ref 22–32)
Calcium: 8.8 mg/dL — ABNORMAL LOW (ref 8.9–10.3)
Chloride: 122 mmol/L — ABNORMAL HIGH (ref 98–111)
Creatinine, Ser: 1.48 mg/dL — ABNORMAL HIGH (ref 0.44–1.00)
GFR calc Af Amer: 42 mL/min — ABNORMAL LOW (ref >60)
GFR calc non Af Amer: 37 mL/min — ABNORMAL LOW (ref >60)
Glucose, Bld: 175 mg/dL — ABNORMAL HIGH (ref 70–99)
Potassium: 3.4 mmol/L — ABNORMAL LOW (ref 3.5–5.1)
Sodium: 148 mmol/L — ABNORMAL HIGH (ref 135–145)

## 2018-07-17 LAB — PHOSPHORUS: Phosphorus: 3.5 mg/dL (ref 2.5–4.6)

## 2018-07-17 LAB — MRSA PCR SCREENING: MRSA by PCR: NEGATIVE

## 2018-07-17 LAB — PROTIME-INR
INR: 2.54
Prothrombin Time: 27 seconds — ABNORMAL HIGH (ref 11.4–15.2)

## 2018-07-17 LAB — MAGNESIUM: Magnesium: 2 mg/dL (ref 1.7–2.4)

## 2018-07-17 LAB — AMMONIA: AMMONIA: 111 umol/L — AB (ref 9–35)

## 2018-07-17 MED ORDER — INSULIN ASPART 100 UNIT/ML ~~LOC~~ SOLN
0.0000 [IU] | SUBCUTANEOUS | Status: DC
  Administered 2018-07-17 (×3): 2 [IU] via SUBCUTANEOUS
  Administered 2018-07-17 – 2018-07-18 (×2): 1 [IU] via SUBCUTANEOUS

## 2018-07-17 MED ORDER — SODIUM CHLORIDE 0.9% FLUSH
10.0000 mL | INTRAVENOUS | Status: DC | PRN
  Administered 2018-07-18: 10 mL
  Filled 2018-07-17: qty 40

## 2018-07-17 MED ORDER — POTASSIUM CHLORIDE 20 MEQ/15ML (10%) PO SOLN
20.0000 meq | Freq: Once | ORAL | Status: AC
  Administered 2018-07-17: 20 meq
  Filled 2018-07-17: qty 15

## 2018-07-17 MED ORDER — SODIUM CHLORIDE 0.9 % IV BOLUS
500.0000 mL | Freq: Once | INTRAVENOUS | Status: AC
  Administered 2018-07-17: 500 mL via INTRAVENOUS

## 2018-07-17 MED ORDER — CHLORHEXIDINE GLUCONATE CLOTH 2 % EX PADS
6.0000 | MEDICATED_PAD | Freq: Every day | CUTANEOUS | Status: DC
  Administered 2018-07-17: 6 via TOPICAL

## 2018-07-17 NOTE — Progress Notes (Signed)
Holding on removal of IJ line 2/2 INR 2.5

## 2018-07-17 NOTE — Progress Notes (Signed)
Physical Therapy Discharge Patient Details Name: Alyssa Mckenzie MRN: [id number] DOB: 16-Aug-1950 Today's Date: 07/17/2018 Time:  -     Patient discharged from PT services secondary to medical decline - will need to re-order PT to resume therapy services.  Please see latest therapy progress note for current level of functioning and progress toward goals.    Progress and discharge plan discussed with patient and/or caregiver: Patient unable to participate in discharge planning and no caregivers available  GP     [name] [facility] 07/17/2018, 8:36 AM   Skip Mayer PT Acute Rehabilitation Services Pager [phone] Office [phone]

## 2018-07-17 NOTE — Progress Notes (Signed)
Progress Note  Patient Name: Alyssa Mckenzie Date of Encounter: 07/17/2018  Primary Cardiologist: No primary care provider on file.   Subjective   Intubated  Inpatient Medications    Scheduled Meds: . chlorhexidine gluconate (MEDLINE KIT)  15 mL Mouth Rinse BID  . feeding supplement (PRO-STAT SUGAR FREE 64)  30 mL Per Tube BID  . folic acid  1 mg Per Tube Daily  . free water  250 mL Per Tube Q6H  . lactulose  30 g Per Tube Q6H  . mouth rinse  15 mL Mouth Rinse 10 times per day  . metoprolol tartrate  25 mg Per Tube BID  . pantoprazole sodium  40 mg Per Tube Daily  . rifaximin  550 mg Per Tube BID  . thiamine  100 mg Per Tube Daily  . umeclidinium bromide  1 puff Inhalation Daily   Continuous Infusions: . sodium chloride 10 mL/hr at 07/17/18 0900  . dextrose 75 mL/hr at 07/17/18 0900  . feeding supplement (VITAL AF 1.2 CAL) 35 mL/hr at 07/17/18 0900   PRN Meds: sodium chloride, ondansetron **OR** ondansetron (ZOFRAN) IV   Vital Signs    Vitals:   07/17/18 0700 07/17/18 0741 07/17/18 0800 07/17/18 0823  BP: 125/63  124/63   Pulse: 99 99 100   Resp: (!) 23 (!) 23 (!) 21   Temp:    98.7 F (37.1 C)  TempSrc:    Oral  SpO2: 94% 95% 95%   Weight:      Height:        Intake/Output Summary (Last 24 hours) at 07/17/2018 0932 Last data filed at 07/17/2018 0900 Gross per 24 hour  Intake 3749.28 ml  Output 2830 ml  Net 919.28 ml   Last 3 Weights 07/17/2018 07/16/2018 07/15/2018  Weight (lbs) 139 lb 12.4 oz 128 lb 1.4 oz 123 lb 3.8 oz  Weight (kg) 63.4 kg 58.1 kg 55.9 kg      Telemetry    Sinus tach - Personally Reviewed  ECG      Physical Exam   GEN: Intubated, sedated   Neck: No JVD Cardiac: tachycardic, no murmurs, rubs, or gallops.  Respiratory: Clear to auscultation bilaterally. GI: Soft, nontender, non-distended  MS: No edema; No deformity. Neuro:  Nonfocal  Psych: Normal affect   Labs    Chemistry Recent Labs  Lab 07/14/18 0337  07/15/18 0319  07/16/18 0659  07/16/18 1706 07/16/18 2226 07/17/18 0345 07/17/18 0400  NA 153* 156*   < > 151*   < > 150* 140 150* 148*  K 3.5 4.3   < > 3.9   < > 4.1  --  3.4* 3.4*  CL 125* 129*   < > 123*  --  125*  --   --  122*  CO2 18* 21*   < > 19*  --  23  --   --  20*  GLUCOSE 122* 115*   < > 139*  --  179*  --   --  175*  BUN 34* 30*   < > 39*  --  43*  --   --  44*  CREATININE 1.24* 1.16*   < > 1.69*  --  1.62*  --   --  1.48*  CALCIUM 8.9 9.3   < > 9.0  --  8.5*  --   --  8.8*  PROT 6.1* 6.1*  --  5.7*  --   --   --   --   --  ALBUMIN 2.5* 2.6*  --  2.6*  --   --   --   --   --   AST 198* 189*  --  228*  --   --   --   --   --   ALT 71* 75*  --  76*  --   --   --   --   --   ALKPHOS 152* 165*  --  147*  --   --   --   --   --   BILITOT 2.4* 2.8*  --  2.5*  --   --   --   --   --   GFRNONAA 45* 49*   < > 31*  --  33*  --   --  37*  GFRAA 52* 57*   < > 36*  --  38*  --   --  42*  ANIONGAP 10 6   < > 9  --  2*  --   --  6   < > = values in this interval not displayed.     Hematology Recent Labs  Lab 07/15/18 0518  07/16/18 0428 07/16/18 1156 07/17/18 0345 07/17/18 0400  WBC 12.9*  --  9.3  --   --  7.3  RBC 3.37*  --  2.89*  --   --  2.86*  HGB 10.1*   < > 8.9* 8.2* 9.2* 8.8*  HCT 32.1*   < > 28.4* 24.0* 27.0* 28.2*  MCV 95.3  --  98.3  --   --  98.6  MCH 30.0  --  30.8  --   --  30.8  MCHC 31.5  --  31.3  --   --  31.2  RDW 20.4*  --  21.5*  --   --  22.2*  PLT 191  --  133*  --   --  PLATELET CLUMPS NOTED ON SMEAR, UNABLE TO ESTIMATE   < > = values in this interval not displayed.    Cardiac Enzymes Recent Labs  Lab 07/11/18 0002 07/11/18 0612 07/11/18 1247 07/15/18 0939  TROPONINI 0.03* 0.04* 0.04* 0.10*   No results for input(s): TROPIPOC in the last 168 hours.   BNP Recent Labs  Lab 07/11/18 0002  BNP 480.1*     DDimer No results for input(s): DDIMER in the last 168 hours.   Radiology    Ct Head Wo Contrast  Result Date:  07/16/2018 CLINICAL DATA:  68 y/o  F; hepatic encephalopathy. EXAM: CT HEAD WITHOUT CONTRAST TECHNIQUE: Contiguous axial images were obtained from the base of the skull through the vertex without intravenous contrast. COMPARISON:  None. FINDINGS: Brain: No evidence of acute infarction, hemorrhage, hydrocephalus, extra-axial collection or mass lesion/mass effect. Few nonspecific white matter hypodensities are compatible with mild chronic microvascular ischemic changes. Mild volume loss of the brain. Vascular: No hyperdense vessel or unexpected calcification. Skull: Normal. Negative for fracture or focal lesion. Sinuses/Orbits: No acute finding. Other: Right intra-ocular lens replacement. IMPRESSION: No acute intracranial abnormality identified. Mild chronic microvascular ischemic changes and volume loss of the brain. Electronically Signed   By: Kristine Garbe M.D.   On: 07/16/2018 13:33   Dg Chest Port 1 View  Result Date: 07/16/2018 CLINICAL DATA:  Intubation, respiratory failure EXAM: PORTABLE CHEST 1 VIEW COMPARISON:  Portable exam 0623 hours compared to 07/15/2018 FINDINGS: Tip of endotracheal tube projects approximately 10 mm above carina. Nasogastric tube extends into stomach. LEFT jugular central venous catheter with tip projecting over  SVC. External pacing leads present. Stable heart size and mediastinal contours. Atherosclerotic calcification aorta. Persistent BILATERAL pulmonary infiltrates of the mid to lower lungs which could represent pulmonary edema or infection. Improved aeration in LEFT upper lobe since previous exam. Small LEFT pleural effusion. No pneumothorax. IMPRESSION: Persistent infiltrates of the mid to lower lungs bilaterally with small associated LEFT pleural effusion, could represent pneumonia or pulmonary edema. Improved aeration in LEFT upper lobe since previous exam. Electronically Signed   By: Lavonia Dana M.D.   On: 07/16/2018 09:40   Vas [place] Lower Extremity Venous  (dvt)  Result Date: 07/16/2018  Lower Venous Study Indications: Pulmonary embolism.  Performing Technologist: Oliver Hum RVT  Examination Guidelines: A complete evaluation includes B-mode imaging, spectral Doppler, color Doppler, and power Doppler as needed of all accessible portions of each vessel. Bilateral testing is considered an integral part of a complete examination. Limited examinations for reoccurring indications may be performed as noted.  Right Venous Findings: +---------+---------------+---------+-----------+----------+-------+          CompressibilityPhasicitySpontaneityPropertiesSummary +---------+---------------+---------+-----------+----------+-------+ CFV      Full           Yes      Yes                          +---------+---------------+---------+-----------+----------+-------+ SFJ      Full                                                 +---------+---------------+---------+-----------+----------+-------+ FV Prox  Full                                                 +---------+---------------+---------+-----------+----------+-------+ FV Mid   Full                                                 +---------+---------------+---------+-----------+----------+-------+ FV DistalFull                                                 +---------+---------------+---------+-----------+----------+-------+ PFV      Full                                                 +---------+---------------+---------+-----------+----------+-------+ POP      Full           Yes      Yes                          +---------+---------------+---------+-----------+----------+-------+ PTV      Full                                                 +---------+---------------+---------+-----------+----------+-------+  PERO     Full                                                 +---------+---------------+---------+-----------+----------+-------+  Left Venous  Findings: +---------+---------------+---------+-----------+----------+-------+          CompressibilityPhasicitySpontaneityPropertiesSummary +---------+---------------+---------+-----------+----------+-------+ CFV      Full           Yes      Yes                          +---------+---------------+---------+-----------+----------+-------+ SFJ      Full                                                 +---------+---------------+---------+-----------+----------+-------+ FV Prox  Full                                                 +---------+---------------+---------+-----------+----------+-------+ FV Mid   Full                                                 +---------+---------------+---------+-----------+----------+-------+ FV DistalFull                                                 +---------+---------------+---------+-----------+----------+-------+ PFV      Full                                                 +---------+---------------+---------+-----------+----------+-------+ POP      Full           Yes      Yes                          +---------+---------------+---------+-----------+----------+-------+ PTV      Full                                                 +---------+---------------+---------+-----------+----------+-------+ PERO     Full                                                 +---------+---------------+---------+-----------+----------+-------+    Summary: Right: There is no evidence of deep vein thrombosis in the lower extremity. No cystic structure found in the popliteal fossa. Left: There is no evidence of deep vein thrombosis in the lower extremity. No cystic structure found in the popliteal fossa.  *  See table(s) above for measurements and observations. Electronically signed by Monica Martinez MD on 07/16/2018 at 2:56:42 PM.    Final     Cardiac Studies   Tele reviewed  Patient Profile     68 y.o. female with  SVT  Assessment & Plan    1) SVT:  Likley due to the stress of her liver issues.  Controlled at this time.  COntinue metoprolol.   [facility] will sign off.   Medication Recommendations:  metoprolol Other recommendations (labs, testing, etc):  None Follow up as an outpatient:  F/u with [facility] cardiologist that she sees regularly  For questions or updates, please contact [facility] Please consult [email or web address] for contact info under        Signed, Larae Grooms, MD  07/17/2018, 9:32 AM

## 2018-07-17 NOTE — Progress Notes (Signed)
eLink Physician-Brief Progress Note Patient Name: Milene Bakker DOB: 01-17-1951 MRN: [id number]   Date of Service  07/17/2018  HPI/Events of Note  K 3.2, AKI  eICU Interventions  Kcl 20 meq via tube/on Vent.      Intervention Category Minor Interventions: Electrolytes abnormality - evaluation and management  Ranee Gosselin 07/17/2018, 6:06 AM

## 2018-07-17 NOTE — Progress Notes (Signed)
Nutrition Follow-up  INTERVENTION:   Continue TF via NG tube: - Vital AF 1.2 @ 35 ml/hr (840 ml/day) - Pro-stat 30 ml BID - Free water per MD  Tube feeding regimen provides 1208 kcal, 93 grams of protein, and 681 ml of H2O (98% of kcal needs, 100% of protein needs).  With D5 at current rate (increased to 75 ml/hr on 2/13), pt is receiving a total of 1514 kcal daily.  Recommend exchanging NGT for Cortrak. Cortrak with smaller bore size and can be bridled to secure placement.  NUTRITION DIAGNOSIS:   Inadequate oral intake related to lethargy/confusion as evidenced by NPO status.  Ongoing  GOAL:   Patient will meet greater than or equal to 90% of their needs  Met via TF  MONITOR:   Vent status, TF tolerance, Skin, Labs, I & O's, Weight trends  REASON FOR ASSESSMENT:   Ventilator, Consult Enteral/tube feeding initiation and management  ASSESSMENT:   Alyssa Mckenzie is a 68 y.o. female with medical history significant of dCHF, COPD, GERD, liver cirrhosis, CKD 3, alcohol use, anemia, who presents with generalized weakness and fatigue.   Pt admitted with ARF on CKD 3.  2/9 - transferred to PCU, NGT placed 2/12 - required cardioversion with sedation, intubated  Discussed pt with RN who reports pt is tolerating TF well.  Pt with 25 lb weight gain since admission; however pt is +5.4 L. Suspect dry weight is admission weight of 114.7 lbs (52 kg).  Messaged attending MD regarding exchanging NGT for Cortrak for comfort and ability to secure tube with bridle. If desired, recommend ordering Cortrak team consult. Cortrak team is available M-W-F-Sa.  Current TF: Vital AF 1.2 @ 30 ml/hr, Pro-stat 30 ml BID, free water 250 ml q 6 hours  Patient remains intubated on ventilator support. MV: 8.8 L/min Temp (24hrs), Avg:98.2 F (36.8 C), Min:97.2 F (36.2 C), Max:99.1 F (37.3 C) BP: 119/58 MAP: 75  Propofol: none D5: 75 ml/hr (provides 306 kcal daily)  Medications reviewed  and include: folic acid, SSI, lactulose, Protonix, thiamine  Labs reviewed: sodium 149 (H), potassium 3.4 (L), BUN 44 (H), creatinine 1.48 (H), hemoglobin 8.8 (L) CBG's: 159, 160, 161, 157 x 24 hours  UOP: 530 ml x 24 hours Rectal tube: 2450 ml x 24 hours I/O's: +5.4 L since admit  Diet Order:   Diet Order            Diet NPO time specified  Diet effective now              EDUCATION NEEDS:   Not appropriate for education at this time  Skin:  Skin Assessment: Skin Integrity Issues: Stage II: sacrum (07/15/18)  Last BM:  2/14 (2450 ml x 24 hours via rectal tube)  Height:   Ht Readings from Last 1 Encounters:  07/20/2018 5' (1.524 m)    Weight:   Wt Readings from Last 1 Encounters:  07/17/18 63.4 kg    Ideal Body Weight:  45.5 kg  BMI:  Body mass index is 27.3 kg/m.  Estimated Nutritional Needs:   Kcal:  1237  Protein:  85-100 grams  Fluid:  per MD    Gaynell Face, MS, RD, LDN Inpatient Clinical Dietitian Pager: [phone] Weekend/After Hours: [phone]

## 2018-07-17 NOTE — Progress Notes (Signed)
Spoke with Dr. Laurence Slate with Neurology in reference to possible formal consult and discuss ongoing encephalopathy.  Some concern over possible cerebral edema given severe metabolic derangements, however, CTH yesterday without evidence of.  Recomends to decrease ammonia to goal of at least < 80 and if still encephalopathic, call back for formal consult.    Posey Boyer, MSN, AGACNP-BC [facility] Pgr: [phone] or if no answer [phone] 07/17/2018, 4:55 PM

## 2018-07-17 NOTE — Progress Notes (Addendum)
NAME:  Alyssa Mckenzie, MRN:  [id number], DOB:  [birth year], LOS: 4 ADMISSION DATE:  07/28/2018, CONSULTATION DATE: 07/15/2018 REFERRING MD: Triad, CHIEF COMPLAINT: Supraventricular tachycardia unstable  Brief History   68 year old failure to thrive patient with multiple medical comorbidities developed supraventricular tachycardia required cardioversion with sedation 07/15/2018  History of present illness   68 year old female who has hepatic cirrhosis with hepatic encephalopathy who is admitted for weakness nausea evaluated by renal for increasing creatinine and GI services for hepatic encephalopathy.  She was in her normal state of poor health on 07/15/2018 and required transfer to intensive care unit for refractory supraventricular cardia.  Blockade was attempted IV without success.  Anesthesia was activated she was given etomidate and cardioverted successfully to a sinus tach of 107.  She has a known elevated ammonia level and is on lactulose and rifampin.  She has hypernatremia for which she has been on free water although now her NG tube is out.  She is a full code pulmonary critical care is been asked to serve her care at this time.  There is a good chance she require intubation for the days over.  Past Medical History  Hepatic encephalopathy Renal failure Failure to thrive Esophageal stricture Former tobacco abuse/ currently vapes Alcohol abuse  Significant Hospital Events   07/15/2018 developed supraventricular cardia required cardioversion  Consults:  Renal GI Pulmonary critical care  Procedures:  07/15/2018 0 654 cardioversion version for supra ventricular tachycardia ETT 2/12 >> Left IJ CVC 2/12 >>   Significant Diagnostic Tests:  07/15/2018 2D echo>>  1. The left ventricle has normal systolic function with an ejection fraction of 60-65%. The cavity size was normal. Left ventricular diastology could not be evaluated due to nondiagnostic images. No evidence of left ventricular  regional wall motion  abnormalities.  2. The right ventricle has moderately reduced systolic function. The cavity was moderately enlarged. There is not assessed.  3. Left atrial size was not assessed.  4. The mitral valve is normal in structure. There is mild mitral annular calcification present. No evidence of mitral valve stenosis.  5. The tricuspid valve is normal in structure.  6. The aortic valve is tricuspid.  7. The ascending aorta and aortic root are normal in size and structure.  8. No evidence of left ventricular regional wall motion abnormalities.  9. Right atrial pressure is estimated at 8 mmHg. 10. The interatrial septum was not assessed.  2/13 BL LE venous duplex <<  Neg for DVT  2/13 CTH >> No acute intracranial abnormality identified. Mild chronic microvascular ischemic changes and volume loss of the brain.  2/13 EEG >> consistent with a severe generalized nonspecific cerebral dysfunction (encephalopathy).  The presence of triphasic waves, while not definite, is suggestive of toxic/metabolic etiology  Micro Data:  MRSA PCR 2/13 >> neg Blood 2/13 >> Urine 2/13 >> Tracheal Asp 2/13 >> few GNR/ mod GPC >>  Antimicrobials:    Interim history/subjective:  Afebrile Improving ammonia Remains unresponsive, no sedation 2/12  Objective   Blood pressure (!) 117/55, pulse 99, temperature 98.2 F (36.8 C), temperature source Oral, resp. rate (!) 22, height 5' (1.524 m), weight 63.4 kg, SpO2 95 %. CVP:  [7 mmHg-62 mmHg] 7 mmHg  Vent Mode: PRVC FiO2 (%):  [50 %] 50 % Set Rate:  [16 bmp] 16 bmp Vt Set:  [370 mL] 370 mL PEEP:  [5 cmH20] 5 cmH20 Plateau Pressure:  [13 cmH20-16 cmH20] 14 cmH20   Intake/Output Summary (Last 24 hours) at 07/17/2018  1157 Last data filed at 07/17/2018 1108 Gross per 24 hour  Intake 4069.28 ml  Output 3030 ml  Net 1039.28 ml   Filed Weights   07/15/18 0340 07/16/18 0500 07/17/18 0400  Weight: 55.9 kg 58.1 kg 63.4 kg    Examination: General:  Chronically ill appearing female on MV unresponsive HEENT: MM pink/moist, ETT/ OGT, pupils 5/brisk Neuro: unresponsive CV: rrr, no m/r/g PULM: even/non-labored on full MV support, lungs bilaterally coarse, very weak cough GI: soft, hyperactive BS Extremities: warm/dry, mild generalized edema, toes purple Skin: no rashes, diffuse ecchymosis   Resolved Hospital Problem list    Assessment & Plan:   Encephalopathy, multifactorial in setting of uremia and hepatic  Cirrhosis  Plan  CT Head neg EEG suggestive of toxic/metabolic etiology, no seizure Ammonia trending down 192- > 111, repeat in am  ABG, CO2 39 Avoid all sedating medications  Consider neurology consult Remains afebrile with normal WBC, following culture data off abx   Supraventricular tachycardia requiring s/p cardioversion 2/12 AM and Adenosine 2/12 pm -ECHO with normal systolic function with RV enlargement  Plan   Cardiac monitor Continue Metoprolol - rate controlled Cardiology signed off today   Acute Hypoxic/Hypercarbic Respiratory Failure Plan  - CXR with stable tubes, ongoing improving bilateral infiltrates vs atelectasis  Full MV support  No SBT given mental status Trend ABG/CXR Pulmonary Hygiene   Acute kidney injury Hypernatremia  Plan  Trend BMP continue 75 ml/hr, Free water at 250 ml q6h Continue foley catheter for now  Decompensated cirrhosis with hepatic encephalopathy evaluated Plan GI Signed off 2/12 Trend Ammonia, LFTs, coag Continue rifampin and lactulose PPI Check LFTs, if wnl, and 2nd PIV obtained, may remove CVL-  Not needed at this point  ETOH abuse  P:  Last drink ? 2/7 Continue thiamine /folate   Failure to thrive P:  Ongoing GOC discussions, patient remains partial code, no CPR  Best practice:  Diet: continue TF VAP protocol (if indicated) DVT prophylaxis: SCDs GI prophylaxis: PPI Glucose control: Scale insulin protocol Mobility:  Bedrest Code Status full Family Communication:  Sister updated at bedside.  Her husband, Lorin Picket Sales promotion account executive of [employer] [facility]) is patient's HPOA.  Patient has lived with them for 5 years after her husband passed, in which she started drinking and vaping then.  Sister reports recent decline over several months.  She states she know she would not want this.    Patient has 3 adult children that live in [place] and [place].  Goal is to continue supportive care over the next several days to assess mental status, if no improvement, will need to discuss transition to comfort care as her sister states she would definitely not want trach/ peg.  Remains partial code, no CPR.  Labs   CBC: Recent Labs  Lab 07/08/2018 1739  07/12/18 0450 07/13/18 0410 07/14/18 7829 07/15/18 0518 07/15/18 0932 07/16/18 0428 07/16/18 1156 07/17/18 0345 07/17/18 0400  WBC 5.5   < > 6.7 8.8 9.7 12.9*  --  9.3  --   --  7.3  NEUTROABS 4.4  --  6.0 7.8*  --   --   --  6.8  --   --   --   HGB 9.6*   < > 9.2* 8.9* 9.9* 10.1* 8.5* 8.9* 8.2* 9.2* 8.8*  HCT 29.3*   < > 28.1* 26.4* 29.7* 32.1* 25.0* 28.4* 24.0* 27.0* 28.2*  MCV 91.8   < > 94.3 92.6 90.8 95.3  --  98.3  --   --  98.6  PLT 178   < > 173 175 156 191  --  133*  --   --  PLATELET CLUMPS NOTED ON SMEAR, UNABLE TO ESTIMATE   < > = values in this interval not displayed.    Basic Metabolic Panel: Recent Labs  Lab 07/14/18 0337 07/15/18 0319  07/15/18 0939 07/16/18 0659 07/16/18 1156 07/16/18 1706 07/16/18 2226 07/17/18 0345 07/17/18 0400 07/17/18 0937  NA 153* 156*   < > 156* 151* 154* 150* 140 150* 148* 149*  K 3.5 4.3   < > 4.9 3.9 3.9 4.1  --  3.4* 3.4*  --   CL 125* 129*  --  127* 123*  --  125*  --   --  122*  --   CO2 18* 21*  --  17* 19*  --  23  --   --  20*  --   GLUCOSE 122* 115*  --  114* 139*  --  179*  --   --  175*  --   BUN 34* 30*  --  31* 39*  --  43*  --   --  44*  --   CREATININE 1.24* 1.16*  --  1.32* 1.69*  --  1.62*  --   --  1.48*  --   CALCIUM  8.9 9.3  --  9.3 9.0  --  8.5*  --   --  8.8*  --   MG 1.8 1.7  --   --  1.6*  --  2.1  --   --  2.0  --   PHOS  --   --   --   --  1.6*  --   --   --   --  3.5  --    < > = values in this interval not displayed.   GFR: Estimated Creatinine Clearance: 31.1 mL/min (A) (by C-G formula based on SCr of 1.48 mg/dL (H)). Recent Labs  Lab 29-Nov-2018 1739  07/14/18 0337 07/15/18 0518 07/15/18 0939 07/16/18 0428 07/16/18 1115 07/17/18 0400  WBC 5.5   < > 9.7 12.9*  --  9.3  --  7.3  LATICACIDVEN 1.7  --   --  5.2* 4.2*  --  2.0*  --    < > = values in this interval not displayed.    Liver Function Tests: Recent Labs  Lab 29-Nov-2018 1739 07/13/18 0410 07/14/18 0337 07/15/18 0319 07/16/18 0659  AST 110* 211* 198* 189* 228*  ALT 43 66* 71* 75* 76*  ALKPHOS 145* 136* 152* 165* 147*  BILITOT 2.2* 2.4* 2.4* 2.8* 2.5*  PROT 6.5 5.8* 6.1* 6.1* 5.7*  ALBUMIN 2.9* 2.4* 2.5* 2.6* 2.6*   No results for input(s): LIPASE, AMYLASE in the last 168 hours. Recent Labs  Lab 07/12/18 0450 07/13/18 0410 07/15/18 0518 07/16/18 1307 07/17/18 0400  AMMONIA 83* 184* 79* 192* 111*    ABG    Component Value Date/Time   PHART 7.289 (L) 07/17/2018 0345   PCO2ART 39.5 07/17/2018 0345   PO2ART 82.0 (L) 07/17/2018 0345   HCO3 19.1 (L) 07/17/2018 0345   TCO2 20 (L) 07/17/2018 0345   ACIDBASEDEF 7.0 (H) 07/17/2018 0345   O2SAT 95.0 07/17/2018 0345     Coagulation Profile: Recent Labs  Lab 07/11/18 0002 07/15/18 0319  INR 1.46 1.98    Cardiac Enzymes: Recent Labs  Lab 29-Nov-2018 1739 07/11/18 0002 07/11/18 0612 07/11/18 1247 07/15/18 0939  TROPONINI 0.04* 0.03* 0.04* 0.04* 0.10*    HbA1C: Hgb  A1c MFr Bld  Date/Time Value Ref Range Status  07/11/2018 12:04 AM 5.2 4.8 - 5.6 % Final    Comment:    (NOTE) Pre diabetes:          5.7%-6.4% Diabetes:              >6.4% Glycemic control for   <7.0% adults with diabetes     CBG: Recent Labs  Lab 07/16/18 1124 07/16/18 1605  07/16/18 1950 07/17/18 0035 07/17/18 0759  GLUCAP 128* 157* 161* 160* 159*    Review of Systems:   Not available secondary to altered mental status  Past Medical History  She,  has a past medical history of CHF (congestive heart failure) (HCC), COPD (chronic obstructive pulmonary disease) (HCC), and Liver failure (HCC).   Surgical History    Past Surgical History:  Procedure Laterality Date  . ABDOMINAL HYSTERECTOMY    . BLADDER SURGERY    . BREAST SURGERY    . CHOLECYSTECTOMY    . TONSILLECTOMY       Social History   reports that she has quit smoking. She has never used smokeless tobacco. She reports current alcohol use of about 4.0 standard drinks of alcohol per week. She reports that she does not use drugs.   Family History   Her family history includes Congestive Heart Failure in her father.   Allergies Allergies  Allergen Reactions  . Statins Other (See Comments)    aching     Home Medications  Prior to Admission medications   Medication Sig Start Date End Date Taking? Authorizing Provider  albuterol (PROVENTIL HFA;VENTOLIN HFA) 108 (90 Base) MCG/ACT inhaler Inhale into the lungs every 6 (six) hours as needed for wheezing or shortness of breath.   Yes [provider]  Cholecalciferol (VITAMIN D3) 125 MCG (5000 UT) TABS Take 1 tablet by mouth daily.   Yes [provider]  fluticasone-salmeterol (ADVAIR HFA) 115-21 MCG/ACT inhaler Inhale 2 puffs into the lungs 2 (two) times daily.   Yes [provider]  furosemide (LASIX) 20 MG tablet Take 20 mg by mouth daily.   Yes [provider]  gabapentin (NEURONTIN) 400 MG capsule Take 800 mg by mouth 3 (three) times daily. 04/11/18  Yes [provider]  hydrOXYzine (ATARAX/VISTARIL) 50 MG tablet Take 1 tablet (50 mg total) by mouth every 4 (four) hours as needed for itching. 04/23/18  Yes Marinda Elk, MD  ibuprofen (ADVIL,MOTRIN) 200 MG tablet Take 400 mg by mouth at bedtime  as needed for moderate pain.   Yes [provider]  metoprolol tartrate (LOPRESSOR) 25 MG tablet Take 25 mg by mouth daily.   Yes [provider]  omeprazole (PRILOSEC) 40 MG capsule Take 40 mg by mouth daily. 04/17/17  Yes [provider]  ondansetron (ZOFRAN-ODT) 4 MG disintegrating tablet Take 4 mg by mouth every 6 (six) hours as needed for nausea.  07/09/18  Yes [provider]  triamcinolone cream (KENALOG) 0.1 % Apply 1 application topically daily as needed for itching. 04/16/18  Yes [provider]  TURMERIC PO Take 2 tablets by mouth daily.   Yes [provider]  umeclidinium bromide (INCRUSE ELLIPTA) 62.5 MCG/INH AEPB Inhale 1 puff into the lungs daily.   Yes [provider]  predniSONE (DELTASONE) 20 MG tablet Take by mouth daily. Taper 04/16/18   [provider]     Critical care time: 55 mins    Posey Boyer, MSN, AGACNP-BC [facility]  Pgr: [phone] or if no answer [phone] 07/17/2018, 11:57 AM  Attending Note:  68 year old female alcoholic with failure to thrive, alcoholic cirrhosis, hepatic encephalopathy, respiratory failure and septic shock.  The patient remains unresponsive with coarse BS on exam.  I reviewed CXR myself, ETT is in a good position.  Discussed with PCCM-NP.  Will continue full vent support for now.  Continue pressor support.  Hydrate.  Continue lactulose.  Family is to arrive later today for a discussion regarding plan of care.  I do not believe patient will do well and has done poorly and done nothing but drink since her husband's passing.  Will discuss upon arrival further.  The patient is critically ill with multiple organ systems failure and requires high complexity decision making for assessment and support, frequent evaluation and titration of therapies, application of advanced monitoring technologies and extensive interpretation of multiple databases.   Critical  Care Time devoted to patient care services described in this note is  32  Minutes. This time reflects time of care of this signee Dr Koren BoundWesam Yacoub. This critical care time does not reflect procedure time, or teaching time or supervisory time of PA/NP/Med student/Med Resident etc but could involve care discussion time.  Alyson ReedyWesam G. Yacoub, M.D. [facility]. Pager: [phone]. After hours pager: [phone].

## 2018-07-17 NOTE — Clinical Social Work Note (Signed)
CSW acknowledges SNF consult. Patient currently intubated. Will follow progress.  Charlynn Court, CSW [phone]

## 2018-07-17 NOTE — Progress Notes (Signed)
Attempted to wean FIO2 from 50 to 40%. Sats dropped to 90%. Increased back to 50% and sats came up to 95%.

## 2018-07-18 ENCOUNTER — Inpatient Hospital Stay (HOSPITAL_COMMUNITY): Payer: Managed Care, Other (non HMO)

## 2018-07-18 LAB — POCT I-STAT 7, (LYTES, BLD GAS, ICA,H+H)
ACID-BASE DEFICIT: 13 mmol/L — AB (ref 0.0–2.0)
Acid-base deficit: 11 mmol/L — ABNORMAL HIGH (ref 0.0–2.0)
Bicarbonate: 14.9 mmol/L — ABNORMAL LOW (ref 20.0–28.0)
Bicarbonate: 17.8 mmol/L — ABNORMAL LOW (ref 20.0–28.0)
Calcium, Ion: 1.24 mmol/L (ref 1.15–1.40)
Calcium, Ion: 1.35 mmol/L (ref 1.15–1.40)
HCT: 26 % — ABNORMAL LOW (ref 36.0–46.0)
HCT: 29 % — ABNORMAL LOW (ref 36.0–46.0)
Hemoglobin: 8.8 g/dL — ABNORMAL LOW (ref 12.0–15.0)
Hemoglobin: 9.9 g/dL — ABNORMAL LOW (ref 12.0–15.0)
O2 Saturation: 92 %
O2 Saturation: 95 %
PO2 ART: 93 mmHg (ref 83.0–108.0)
Patient temperature: 101.3
Potassium: 3.1 mmol/L — ABNORMAL LOW (ref 3.5–5.1)
Potassium: 3.7 mmol/L (ref 3.5–5.1)
SODIUM: 146 mmol/L — AB (ref 135–145)
Sodium: 146 mmol/L — ABNORMAL HIGH (ref 135–145)
TCO2: 16 mmol/L — ABNORMAL LOW (ref 22–32)
TCO2: 19 mmol/L — ABNORMAL LOW (ref 22–32)
pCO2 arterial: 47.4 mmHg (ref 32.0–48.0)
pCO2 arterial: 53.6 mmHg — ABNORMAL HIGH (ref 32.0–48.0)
pH, Arterial: 7.115 — CL (ref 7.350–7.450)
pH, Arterial: 7.13 — CL (ref 7.350–7.450)
pO2, Arterial: 103 mmHg (ref 83.0–108.0)

## 2018-07-18 LAB — BLOOD CULTURE ID PANEL (REFLEXED)
Acinetobacter baumannii: NOT DETECTED
Candida albicans: NOT DETECTED
Candida glabrata: NOT DETECTED
Candida krusei: NOT DETECTED
Candida parapsilosis: NOT DETECTED
Candida tropicalis: NOT DETECTED
Carbapenem resistance: NOT DETECTED
ENTEROCOCCUS SPECIES: NOT DETECTED
Enterobacter cloacae complex: NOT DETECTED
Enterobacteriaceae species: DETECTED — AB
Escherichia coli: DETECTED — AB
Haemophilus influenzae: NOT DETECTED
Klebsiella oxytoca: NOT DETECTED
Klebsiella pneumoniae: NOT DETECTED
LISTERIA MONOCYTOGENES: NOT DETECTED
Neisseria meningitidis: NOT DETECTED
Proteus species: NOT DETECTED
Pseudomonas aeruginosa: NOT DETECTED
Serratia marcescens: NOT DETECTED
Staphylococcus aureus (BCID): NOT DETECTED
Staphylococcus species: NOT DETECTED
Streptococcus agalactiae: NOT DETECTED
Streptococcus pneumoniae: NOT DETECTED
Streptococcus pyogenes: NOT DETECTED
Streptococcus species: NOT DETECTED

## 2018-07-18 LAB — COMPREHENSIVE METABOLIC PANEL
ALT: 50 U/L — ABNORMAL HIGH (ref 0–44)
AST: 120 U/L — AB (ref 15–41)
Albumin: 1.4 g/dL — ABNORMAL LOW (ref 3.5–5.0)
Alkaline Phosphatase: 97 U/L (ref 38–126)
Anion gap: 8 (ref 5–15)
BUN: 52 mg/dL — AB (ref 8–23)
CO2: 20 mmol/L — ABNORMAL LOW (ref 22–32)
Calcium: 7.3 mg/dL — ABNORMAL LOW (ref 8.9–10.3)
Chloride: 119 mmol/L — ABNORMAL HIGH (ref 98–111)
Creatinine, Ser: 1.76 mg/dL — ABNORMAL HIGH (ref 0.44–1.00)
GFR calc Af Amer: 34 mL/min — ABNORMAL LOW (ref >60)
GFR calc non Af Amer: 30 mL/min — ABNORMAL LOW (ref >60)
Glucose, Bld: 119 mg/dL — ABNORMAL HIGH (ref 70–99)
Potassium: 3 mmol/L — ABNORMAL LOW (ref 3.5–5.1)
Sodium: 147 mmol/L — ABNORMAL HIGH (ref 135–145)
Total Bilirubin: 2.3 mg/dL — ABNORMAL HIGH (ref 0.3–1.2)
Total Protein: 3.9 g/dL — ABNORMAL LOW (ref 6.5–8.1)

## 2018-07-18 LAB — CBC
HCT: 28.4 % — ABNORMAL LOW (ref 36.0–46.0)
Hemoglobin: 8.6 g/dL — ABNORMAL LOW (ref 12.0–15.0)
MCH: 31 pg (ref 26.0–34.0)
MCHC: 30.3 g/dL (ref 30.0–36.0)
MCV: 102.5 fL — ABNORMAL HIGH (ref 80.0–100.0)
Platelets: 90 10*3/uL — ABNORMAL LOW (ref 150–400)
RBC: 2.77 MIL/uL — ABNORMAL LOW (ref 3.87–5.11)
RDW: 23.1 % — ABNORMAL HIGH (ref 11.5–15.5)
WBC: 3.9 10*3/uL — ABNORMAL LOW (ref 4.0–10.5)
nRBC: 17.8 % — ABNORMAL HIGH (ref 0.0–0.2)

## 2018-07-18 LAB — PROTIME-INR
INR: 2.41
PROTHROMBIN TIME: 25.9 s — AB (ref 11.4–15.2)

## 2018-07-18 LAB — SODIUM
Sodium: 144 mmol/L (ref 135–145)
Sodium: 147 mmol/L — ABNORMAL HIGH (ref 135–145)

## 2018-07-18 LAB — PROCALCITONIN: PROCALCITONIN: 9.67 ng/mL

## 2018-07-18 LAB — LACTIC ACID, PLASMA
Lactic Acid, Venous: 3.7 mmol/L (ref 0.5–1.9)
Lactic Acid, Venous: 3.2 mmol/L (ref 0.5–1.9)
Lactic Acid, Venous: 7.5 mmol/L (ref 0.5–1.9)
Lactic Acid, Venous: 9.5 mmol/L (ref 0.5–1.9)

## 2018-07-18 LAB — GLUCOSE, CAPILLARY
Glucose-Capillary: 88 mg/dL (ref 70–99)
Glucose-Capillary: 133 mg/dL — ABNORMAL HIGH (ref 70–99)

## 2018-07-18 LAB — AMMONIA: Ammonia: 79 umol/L — ABNORMAL HIGH (ref 9–35)

## 2018-07-18 LAB — CORTISOL: Cortisol, Plasma: 31.6 ug/dL

## 2018-07-18 LAB — FIBRINOGEN: Fibrinogen: 299 mg/dL (ref 210–475)

## 2018-07-18 MED ORDER — VANCOMYCIN HCL 10 G IV SOLR
1250.0000 mg | INTRAVENOUS | Status: DC
  Administered 2018-07-18: 1250 mg via INTRAVENOUS
  Filled 2018-07-18: qty 1250

## 2018-07-18 MED ORDER — NOREPINEPHRINE 4 MG/250ML-% IV SOLN
0.0000 ug/min | INTRAVENOUS | Status: DC
  Administered 2018-07-18: 2 ug/min via INTRAVENOUS
  Filled 2018-07-18: qty 250

## 2018-07-18 MED ORDER — SODIUM BICARBONATE 8.4 % IV SOLN
200.0000 meq | Freq: Once | INTRAVENOUS | Status: AC
  Administered 2018-07-18: 200 meq via INTRAVENOUS
  Filled 2018-07-18: qty 200

## 2018-07-18 MED ORDER — ALBUMIN HUMAN 5 % IV SOLN
INTRAVENOUS | Status: AC
  Administered 2018-07-18: 12.5 g
  Filled 2018-07-18: qty 250

## 2018-07-18 MED ORDER — VASOPRESSIN 20 UNIT/ML IV SOLN
0.0300 [IU]/min | INTRAVENOUS | Status: DC
  Administered 2018-07-18: 0.03 [IU]/min via INTRAVENOUS
  Filled 2018-07-18 (×2): qty 2

## 2018-07-18 MED ORDER — THIAMINE HCL 100 MG/ML IJ SOLN
500.0000 mg | Freq: Two times a day (BID) | INTRAVENOUS | Status: DC
  Filled 2018-07-18: qty 5

## 2018-07-18 MED ORDER — NOREPINEPHRINE 16 MG/250ML-% IV SOLN
0.0000 ug/min | INTRAVENOUS | Status: DC
  Administered 2018-07-18: 50 ug/min via INTRAVENOUS
  Administered 2018-07-18: 2 ug/min via INTRAVENOUS
  Administered 2018-07-18: 50 ug/min via INTRAVENOUS
  Filled 2018-07-18 (×4): qty 250

## 2018-07-18 MED ORDER — SODIUM BICARBONATE 8.4 % IV SOLN
100.0000 meq | Freq: Once | INTRAVENOUS | Status: AC
  Administered 2018-07-18: 100 meq via INTRAVENOUS
  Filled 2018-07-18: qty 50

## 2018-07-18 MED ORDER — ALBUMIN HUMAN 5 % IV SOLN
25.0000 g | INTRAVENOUS | Status: AC
  Administered 2018-07-18: 12.5 g via INTRAVENOUS
  Filled 2018-07-18: qty 250

## 2018-07-18 MED ORDER — SODIUM BICARBONATE 8.4 % IV SOLN
INTRAVENOUS | Status: DC
  Administered 2018-07-18 (×2): via INTRAVENOUS
  Filled 2018-07-18 (×3): qty 150

## 2018-07-18 MED ORDER — SODIUM BICARBONATE 8.4 % IV SOLN
INTRAVENOUS | Status: AC
  Filled 2018-07-18: qty 50

## 2018-07-18 MED ORDER — SODIUM CHLORIDE 0.9 % IV SOLN
2.0000 g | INTRAVENOUS | Status: DC
  Administered 2018-07-18: 2 g via INTRAVENOUS
  Filled 2018-07-18: qty 2

## 2018-07-18 MED ORDER — POTASSIUM CHLORIDE 20 MEQ/15ML (10%) PO SOLN
20.0000 meq | ORAL | Status: AC
  Administered 2018-07-18 (×2): 20 meq
  Filled 2018-07-18 (×2): qty 15

## 2018-07-19 LAB — URINE CULTURE: Culture: >=100000 — AB

## 2018-07-20 LAB — CULTURE, RESPIRATORY W GRAM STAIN: Culture: NORMAL

## 2018-07-20 LAB — CULTURE, BLOOD (ROUTINE X 2)

## 2018-07-21 ENCOUNTER — Telehealth: Payer: Self-pay | Admitting: Pulmonary Disease

## 2018-07-21 LAB — CULTURE, BLOOD (ROUTINE X 2)
Culture: NO GROWTH
Culture: NO GROWTH
Special Requests: ADEQUATE
Special Requests: ADEQUATE

## 2018-07-21 LAB — CULTURE, RESPIRATORY W GRAM STAIN

## 2018-07-21 LAB — PATHOLOGIST SMEAR REVIEW

## 2018-07-21 LAB — CULTURE, RESPIRATORY

## 2018-07-21 NOTE — Telephone Encounter (Signed)
07/21/2018 Received D/C from [facility]. Will call Dr. Kendrick Fries  To see if he will sign for Dr.Yacoub sending to Dr.Mcquaid at 3/m or 44m  PWR.  07/23/2018 Received signed D/C back from Dr. Kendrick Fries called [facility] to pick up. [phone]. PWR

## 2018-08-02 NOTE — Progress Notes (Signed)
PHARMACY - PHYSICIAN COMMUNICATION CRITICAL VALUE ALERT - BLOOD CULTURE IDENTIFICATION (BCID)  Alyssa Mckenzie is an 68 y.o. female who presented to [facility] on 07/24/2018 with a chief complaint of sepsis  Assessment:  BCID came back with e.coli. Pt is comfort care.   Name of physician (or Provider) Contacted: 2H Rn  Current antibiotics: None  Changes to prescribed antibiotics recommended:  None  Results for orders placed or performed during the hospital encounter of 07/26/2018  Blood Culture ID Panel (Reflexed) (Collected: 2018/07/26  2:35 AM)  Result Value Ref Range   Enterococcus species NOT DETECTED NOT DETECTED   Listeria monocytogenes NOT DETECTED NOT DETECTED   Staphylococcus species NOT DETECTED NOT DETECTED   Staphylococcus aureus (BCID) NOT DETECTED NOT DETECTED   Streptococcus species NOT DETECTED NOT DETECTED   Streptococcus agalactiae NOT DETECTED NOT DETECTED   Streptococcus pneumoniae NOT DETECTED NOT DETECTED   Streptococcus pyogenes NOT DETECTED NOT DETECTED   Acinetobacter baumannii NOT DETECTED NOT DETECTED   Enterobacteriaceae species DETECTED (A) NOT DETECTED   Enterobacter cloacae complex NOT DETECTED NOT DETECTED   Escherichia coli DETECTED (A) NOT DETECTED   Klebsiella oxytoca NOT DETECTED NOT DETECTED   Klebsiella pneumoniae NOT DETECTED NOT DETECTED   Proteus species NOT DETECTED NOT DETECTED   Serratia marcescens NOT DETECTED NOT DETECTED   Carbapenem resistance NOT DETECTED NOT DETECTED   Haemophilus influenzae NOT DETECTED NOT DETECTED   Neisseria meningitidis NOT DETECTED NOT DETECTED   Pseudomonas aeruginosa NOT DETECTED NOT DETECTED   Candida albicans NOT DETECTED NOT DETECTED   Candida glabrata NOT DETECTED NOT DETECTED   Candida krusei NOT DETECTED NOT DETECTED   Candida parapsilosis NOT DETECTED NOT DETECTED   Candida tropicalis NOT DETECTED NOT DETECTED    Ulyses Southward, PharmD, BCIDP, AAHIVP, CPP Infectious Disease Pharmacist 26-Jul-2018 4:25  PM

## 2018-08-02 NOTE — Progress Notes (Signed)
Pharmacy Antibiotic Note  Alyssa Mckenzie is a 68 y.o. female admitted on 07/11/2018 with sepsis.  Pharmacy has been consulted for vancomycin and cefepime dosing. Pt is febrile with tmax 102.5 and WBC is WNL. SCr is elevated at 1.48.   Plan: Vancmycin 1250mg  IV Q48H Cefepime 2gm IV Q24H F/u renal fxn, C&S, clinical status and peak/trough at SS  Height: 5' (152.4 cm) Weight: 139 lb 12.4 oz (63.4 kg) IBW/kg (Calculated) : 45.5  Temp (24hrs), Avg:99.5 F (37.5 C), Min:98.2 F (36.8 C), Max:102.5 F (39.2 C)  Recent Labs  Lab 07/13/18 0410 07/14/18 0337 07/15/18 0319 07/15/18 0518 07/15/18 0939 07/16/18 0428 07/16/18 0659 07/16/18 1115 07/16/18 1706 07/17/18 0400  WBC 8.8 9.7  --  12.9*  --  9.3  --   --   --  7.3  CREATININE 1.88* 1.24* 1.16*  --  1.32*  --  1.69*  --  1.62* 1.48*  LATICACIDVEN  --   --   --  5.2* 4.2*  --   --  2.0*  --   --     Estimated Creatinine Clearance: 31.1 mL/min (A) (by C-G formula based on SCr of 1.48 mg/dL (H)).    Allergies  Allergen Reactions  . Statins Other (See Comments)    aching    Antimicrobials this admission: Vanc 2/15>> Cefepime 2/15>>  Dose adjustments this admission: N/A  Microbiology results: Pending  Thank you for allowing pharmacy to be a part of this patient's care.  Frankee Gritz, Drake Leach Aug 14, 2018 1:52 AM

## 2018-08-02 NOTE — Progress Notes (Signed)
NAME:  Alyssa Mckenzie, MRN:  [id number], DOB:  June 17, 1950, LOS: 5 ADMISSION DATE:  07/23/2018, REFERRING MD:  ,   Brief History   68 year old failure to thrive patient with multiple medical comorbidities developed supraventricular tachycardia required cardioversion with sedation 07/15/2018. She developed progressive metabolic encephalopathy.   History of present illness   68 year old female who has hepatic cirrhosis with hepatic encephalopathy who is admitted for weakness nausea evaluated by renal for increasing creatinine and GI services for hepatic encephalopathy.  She was in her normal state of poor health on 07/15/2018 and required transfer to intensive care unit for refractory supraventricular cardia.  Blockade was attempted IV without success.  Anesthesia was activated she was given etomidate and cardioverted successfully to a sinus tach of 107.  She has a known elevated ammonia level and is on lactulose and rifampin.  She has hypernatremia for which she has been on free water although now her NG tube is out.  She was a full code. Critical Care was asked to evaluate.   Past Medical History  Hepatic encephalopathy Renal failure Failure to thrive Esophageal stricture Former tobacco abuse/ currently vapes Alcohol abuse  Significant Hospital Events   Jul 31, 2018: Hypotension and lactic acidosis. Cultures ordered. Mental status unchanged. Noted to be mottled and cyanosis peripherally 07/15/2018 developed supraventricular cardia required cardioversion  Consults:  [facility] Cardiology GI Nephrology   Procedures:  07/15/2018 0 654 cardioversion version for supra ventricular tachycardia ETT 2/12  Left IJ CVC 2/12   Significant Diagnostic Tests:  2.14.2020: Cultures  07/15/2018 2D echo 1. The left ventricle has normal systolic function with an ejection fraction of 60-65%. The cavity size was normal. Left ventricular diastology could not be evaluated due to nondiagnostic images. No evidence of  left ventricular regional wall motion  abnormalities. 2. The right ventricle has moderately reduced systolic function. The cavity was moderately enlarged. There is not assessed. 3. Left atrial size was not assessed. 4. The mitral valve is normal in structure. There is mild mitral annular calcification present. No evidence of mitral valve stenosis. 5. The tricuspid valve is normal in structure. 6. The aortic valve is tricuspid. 7. The ascending aorta and aortic root are normal in size and structure. 8. No evidence of left ventricular regional wall motion abnormalities. 9. Right atrial pressure is estimated at 8 mmHg. 10. The interatrial septum was not assessed.  2/13 BL LE venous duplex <<  Neg for DVT  2/13 CTH No acute intracranial abnormality identified. Mild chronic microvascular ischemic changes and volume loss of the brain.  2/13 EEG  consistent with a severe generalized nonspecific cerebral dysfunction (encephalopathy). The presence of triphasic waves, while not definite, is suggestive of toxic/metabolic etiology  Micro Data:  MRSA PCR 2/13 >> neg Blood 2/13 and 07/17/2018: Pending Urine 2/13 : Multiple species suggestive of contaminant Tracheal Asp 2/13 >> few GNR/ mod GPC   Antimicrobials:  Cefepime and Vancomycin  Interim history/subjective:  Patient developed worsening hypotension with escalation and increase in vasopressors (levophed and vasopressin). Ammonia improved but lactic acid elevated. On no sedation, shows no response. Cultures ordered. Minimal response to fluids. Peripheral cyanosis and mottling. Not tolerating tube feeds.   Objective   Blood pressure (!) 87/58, pulse (!) 104, temperature 98.8 F (37.1 C), temperature source Oral, resp. rate (!) 30, height 5' (1.524 m), weight 63.4 kg, SpO2 95 %. CVP:  [4 mmHg-10 mmHg] 10 mmHg  Vent Mode: PRVC FiO2 (%):  [40 %-100 %] 100 % Set Rate:  [16 bmp-30  bmp] 30 bmp Vt Set:  [370 mL] 370 mL PEEP:  [5  cmH20-10 cmH20] 5 cmH20 Plateau Pressure:  [13 cmH20-16 cmH20] 14 cmH20   Intake/Output Summary (Last 24 hours) at 08/03/2018 1150 Last data filed at 08/03/2018 1100 Gross per 24 hour  Intake 6145.16 ml  Output 1580 ml  Net 4565.16 ml   Filed Weights   07/15/18 0340 07/16/18 0500 07/17/18 0400  Weight: 55.9 kg 58.1 kg 63.4 kg    Examination: General: Intubated, Toxic, ill unresponsive, NAD HENT: Dilated pupils but reactive. Icteric eyes. Mild injection. No nystagmus Lungs: Rhonchi. CC ultrasound shows consolidations bilaterally, mild effusion Cardiovascular: BP as noted above. Tachycardic, underfilled LV on basic critical care [place]. No effusion. Mottled extremities, cyanosis with severe mottling of feet. No palpable pulses Abdomen: Slightly distended. No appreciable tenderness. Not firm Extremities: Mottling as noted above. Thin with atrophy of muscles Neuro: GCS: 3i.  GU: foley present and necessary  Resolved Hospital Problem list   N/A  Assessment & Plan:  1. Sepsis with shock 2. Metabolic Encephalopathy 3. Coma 4. Lactic acidosis with peripheral ischemia and cirrhosis 5. SVT 6. Acute hypoxic respiratory failure with hypercarbia 7. Acute kidney injury, ATN from sepsis 8. Hypernatremia-- 9. Cirrhosis with decompensation 10. Alcohol abuse 11. Adult failure to thrive  Plan: Cultures sent Met with family. Plan for transition to comfort care Discontinue non-essential treatments Supportive care End-of life discussion and palliative care orders Best practice:  Diet: NPO Pain/Anxiety/Delirium protocol (if indicated): Palliative care VAP protocol (if indicated): Continue until extubated DVT prophylaxis:N/A coagulopathyic GI prophylaxis: discontinue Glucose control: comfort care Mobility: N/A Code Status: Comfort care Family Communication:Had lengthy discussion with family ([name], [name]: son). Patient would not want this level of care and since she is worsening, they  have elected to withdraw given her previous stated wishes. Discussed options including aggressive care. They declined aggressive and conservative care and want to transition to comfort care. Have assured integrity of the process and dignity for the patient.  Await additional family members Disposition: Transition to comfort care  Labs   CBC: Recent Labs  Lab 07/12/18 0450 07/13/18 0410 07/14/18 0337 07/15/18 0518  07/16/18 0428  07/17/18 0345 07/17/18 0400 August 03, 2018 0050 August 03, 2018 0327 03-Aug-2018 0525  WBC 6.7 8.8 9.7 12.9*  --  9.3  --   --  7.3  --  3.9*  --   NEUTROABS 6.0 7.8*  --   --   --  6.8  --   --   --   --   --   --   HGB 9.2* 8.9* 9.9* 10.1*   < > 8.9*   < > 9.2* 8.8* 8.8* 8.6* 9.9*  HCT 28.1* 26.4* 29.7* 32.1*   < > 28.4*   < > 27.0* 28.2* 26.0* 28.4* 29.0*  MCV 94.3 92.6 90.8 95.3  --  98.3  --   --  98.6  --  102.5*  --   PLT 173 175 156 191  --  133*  --   --  PLATELET CLUMPS NOTED ON SMEAR, UNABLE TO ESTIMATE  --  90*  --    < > = values in this interval not displayed.    Basic Metabolic Panel: Recent Labs  Lab 07/14/18 0337 07/15/18 0319  07/15/18 0939 07/16/18 0659  07/16/18 1706  07/17/18 0345 07/17/18 0400  07/17/18 2314 08/03/2018 0050 Aug 03, 2018 0143 08/03/2018 0525 Aug 03, 2018 0919  NA 153* 156*   < > 156* 151*   < > 150*   < >  150* 148*   < > 144 146* 147* 146* 147*  K 3.5 4.3   < > 4.9 3.9   < > 4.1  --  3.4* 3.4*  --   --  3.7 3.0* 3.1*  --   CL 125* 129*  --  127* 123*  --  125*  --   --  122*  --   --   --  119*  --   --   CO2 18* 21*  --  17* 19*  --  23  --   --  20*  --   --   --  20*  --   --   GLUCOSE 122* 115*  --  114* 139*  --  179*  --   --  175*  --   --   --  119*  --   --   BUN 34* 30*  --  31* 39*  --  43*  --   --  44*  --   --   --  52*  --   --   CREATININE 1.24* 1.16*  --  1.32* 1.69*  --  1.62*  --   --  1.48*  --   --   --  1.76*  --   --   CALCIUM 8.9 9.3  --  9.3 9.0  --  8.5*  --   --  8.8*  --   --   --  7.3*  --   --   MG 1.8  1.7  --   --  1.6*  --  2.1  --   --  2.0  --   --   --   --   --   --   PHOS  --   --   --   --  1.6*  --   --   --   --  3.5  --   --   --   --   --   --    < > = values in this interval not displayed.   GFR: Estimated Creatinine Clearance: 26.2 mL/min (A) (by C-G formula based on SCr of 1.76 mg/dL (H)). Recent Labs  Lab 07/15/18 0518  07/16/18 0428  07/17/18 0400 08/04/2018 0057 2018-08-04 0327 Aug 04, 2018 0410 08/04/18 0919  PROCALCITON  --   --   --   --   --   --   --   --  9.67  WBC 12.9*  --  9.3  --  7.3  --  3.9*  --   --   LATICACIDVEN 5.2*   < >  --    < >  --  3.7* 3.2* 7.5* 9.5*   < > = values in this interval not displayed.    Liver Function Tests: Recent Labs  Lab 07/13/18 0410 07/14/18 0337 07/15/18 0319 07/16/18 0659 08/04/2018 0143  AST 211* 198* 189* 228* 120*  ALT 66* 71* 75* 76* 50*  ALKPHOS 136* 152* 165* 147* 97  BILITOT 2.4* 2.4* 2.8* 2.5* 2.3*  PROT 5.8* 6.1* 6.1* 5.7* 3.9*  ALBUMIN 2.4* 2.5* 2.6* 2.6* 1.4*   No results for input(s): LIPASE, AMYLASE in the last 168 hours. Recent Labs  Lab 07/13/18 0410 07/15/18 0518 07/16/18 1307 07/17/18 0400 08-04-2018 0410  AMMONIA 184* 79* 192* 111* 79*    ABG    Component Value Date/Time   PHART 7.130 (LL) 2018-08-04 0525   PCO2ART 53.6 (H) August 04, 2018 0525   PO2ART 103.0  08-03-18 0525   HCO3 17.8 (L) 08/03/18 0525   TCO2 19 (L) 08/03/2018 0525   ACIDBASEDEF 11.0 (H) Aug 03, 2018 0525   O2SAT 95.0 08-03-18 0525     Coagulation Profile: Recent Labs  Lab 07/15/18 0319 07/17/18 1450 08/03/18 0327  INR 1.98 2.54 2.41    Cardiac Enzymes: Recent Labs  Lab 07/11/18 1247 07/15/18 0939  TROPONINI 0.04* 0.10*    HbA1C: Hgb A1c MFr Bld  Date/Time Value Ref Range Status  07/11/2018 12:04 AM 5.2 4.8 - 5.6 % Final    Comment:    (NOTE) Pre diabetes:          5.7%-6.4% Diabetes:              >6.4% Glycemic control for   <7.0% adults with diabetes     CBG: Recent Labs  Lab  07/17/18 1620 07/17/18 1935 07/17/18 2302 Aug 03, 2018 0431 08/03/2018 0812  GLUCAP 180* 159* 147* 88 133*    Review of Systems:   Unable to obtain  Past Medical History  She,  has a past medical history of CHF (congestive heart failure) ([place]), COPD (chronic obstructive pulmonary disease) ([place]), and Liver failure ([place]).   Surgical History    Past Surgical History:  Procedure Laterality Date  . ABDOMINAL HYSTERECTOMY    . BLADDER SURGERY    . BREAST SURGERY    . CHOLECYSTECTOMY    . TONSILLECTOMY       Social History   reports that she has quit smoking. She has never used smokeless tobacco. She reports current alcohol use of about 4.0 standard drinks of alcohol per week. She reports that she does not use drugs.   Family History   Her family history includes Congestive Heart Failure in her father.   Allergies Allergies  Allergen Reactions  . Statins Other (See Comments)    aching     Home Medications  Prior to Admission medications   Medication Sig Start Date End Date Taking? Authorizing Provider  albuterol (PROVENTIL HFA;VENTOLIN HFA) 108 (90 Base) MCG/ACT inhaler Inhale into the lungs every 6 (six) hours as needed for wheezing or shortness of breath.   Yes [provider]  Cholecalciferol (VITAMIN D3) 125 MCG (5000 UT) TABS Take 1 tablet by mouth daily.   Yes [provider]  fluticasone-salmeterol (ADVAIR HFA) 115-21 MCG/ACT inhaler Inhale 2 puffs into the lungs 2 (two) times daily.   Yes [provider]  furosemide (LASIX) 20 MG tablet Take 20 mg by mouth daily.   Yes [provider]  gabapentin (NEURONTIN) 400 MG capsule Take 800 mg by mouth 3 (three) times daily. 04/11/18  Yes [provider]  hydrOXYzine (ATARAX/VISTARIL) 50 MG tablet Take 1 tablet (50 mg total) by mouth every 4 (four) hours as needed for itching. 04/23/18  Yes Charlynne Cousins, MD  ibuprofen (ADVIL,MOTRIN) 200 MG tablet Take 400 mg by mouth at bedtime as  needed for moderate pain.   Yes [provider]  metoprolol tartrate (LOPRESSOR) 25 MG tablet Take 25 mg by mouth daily.   Yes [provider]  omeprazole (PRILOSEC) 40 MG capsule Take 40 mg by mouth daily. 04/17/17  Yes [provider]  ondansetron (ZOFRAN-ODT) 4 MG disintegrating tablet Take 4 mg by mouth every 6 (six) hours as needed for nausea.  07/09/18  Yes [provider]  triamcinolone cream (KENALOG) 0.1 % Apply 1 application topically daily as needed for itching. 04/16/18  Yes [provider]  TURMERIC PO Take 2 tablets  by mouth daily.   Yes [provider]  umeclidinium bromide (INCRUSE ELLIPTA) 62.5 MCG/INH AEPB Inhale 1 puff into the lungs daily.   Yes [provider]  predniSONE (DELTASONE) 20 MG tablet Take by mouth daily. Taper 04/16/18   [provider]     Critical care time: 70 minutes

## 2018-08-02 NOTE — Significant Event (Addendum)
Called by [facility] Nurse for worsening hypoxemia and new onset hypertension.  Briefly, patient here for severe metabolic encephalopathy regarding intubation, hepatic encephalopathy and uremia.  In speaking with her nurse, she states that her mental status has remained the same, without a gag and not responding to pain with pupillary reflexes.  However throughout the shift she became hypotensive, responded to IV fluids and so metoprolol was given in light of persistent supraventricular tachycardia.  However now she is hypotensive, febrile greater than 102 degrees.  On exam she is ill-appearing, nonresponsive, synchronous with the ventilator, tachycardic though regular.  Skin mottled in multiple areas and extremities cool to the touch.  # shock unresponsive to IVF - start norepinephrine # worsening hypoxemic respiratory failure -continue FiO2 of 100% with poor pulse ox readings however most recent arterial blood gas with adequate oxygenation despite high level of hypoxemia as she was requiring 50% earlier tonight. Hold on further IVF as she has not responded thus far and severe hypoxemia # fever, concern for sepsis of unclear source -no obvious skin source, consider urine for VAP.  Will check cultures, start antibiotics, check CXR though morning CXR actually showed improving aeration compared to prior # metabolic acidosis -follow-up on lactic acid.  Check renal indices given oliguria with concern for worsening renal failure causing acidosis.   [place] Darien Mignogna, MD [facility] Pulmonology/Critical Care Pager [phone] After hours pager: [phone]

## 2018-08-02 NOTE — Progress Notes (Signed)
eLink Physician-Brief Progress Note Patient Name: Alyssa Mckenzie DOB: Dec 10, 1950 MRN: [id number]   Date of Service  07/24/2018  HPI/Events of Note  Hypokalemia  eICU Interventions  Potassium replaced     Intervention Category Intermediate Interventions: Electrolyte abnormality - evaluation and management  Antanette Richwine 07/17/2018, 3:24 AM

## 2018-08-02 NOTE — Progress Notes (Signed)
Patient's time of death was 28. Almyra Deforest, RN and Rennis Harding, RN listened for a full minute and confirmed asystole. Family and healthcare POA, Marion Downer, at bedside. Chaplain offered.

## 2018-08-02 NOTE — Progress Notes (Signed)
eLink Physician-Brief Progress Note Patient Name: Alyssa Mckenzie DOB: 07/01/1950 MRN: [id number]   Date of Service  08-04-18  HPI/Events of Note  Abrupt fall in BP with drop in sats following bouts of hypotension, tachycardia.  Had been given a bolus with some improvement in BP. But sats only intermittently picking up.  ABG earlier in AM showed some metabolic acidosis.  Now pH 7.11/47.4/93/14.9  eICU Interventions  Plan: 2 amps of bicarb IVP Bicarb gtt Recheck ABG in 2 hours Check lactate     Intervention Category Major Interventions: Acid-Base disturbance - evaluation and management;Hypotension - evaluation and management  Alyssa Mckenzie 08-04-2018, 12:58 AM

## 2018-08-02 NOTE — Progress Notes (Signed)
Around 2100, patient BP dropped to 80's systolic, CVP was 6, and patient's urine output was starting to decrease. Elink was notified. Verbal orders were to give 500cc bolus of NS. BP was not improved afterwards. BP dropped to 50's systolic, oxygen saturation were dropping into the mid 80's. Pt appeared to aspirate on tube feeds. Tube feeds were immediately paused and ELink was notifed. Another 500cc bolus of fluids were given. Unresponsive to fluids, patient was started on levophed. Orders were to get a blood gas, lactic acid, and CMP. PH was 7.11 and Bicarb was 14.9. was Bicarb was given and Bicarb drip was started at 168mL/hr. Lactic acid was 3.7, antibiotics were ordered. Systolic BP was in the 80's at of Levophed. MD was paged. Orders were to start patient on Vasopressin. BP is slightly improving, pt is still not having good urine output. Will continue to monitor for any changes.

## 2018-08-02 DEATH — deceased

## 2018-09-02 NOTE — Death Summary Note (Signed)
Alyssa Mckenzie was a 68 y.o. female admitted on July 18, 2018 with decreased appetite, and lethargy.  Found to be hypoxic in ER.  She had recent EGD for Candidal esophagitis.  Found to have renal failure and elevated ammonia level.  Started on IV fluids and lactulose.  Renal function and mental status got worse.  GI and nephrology consulted.  He developed SVT and hypotension.  Transferred to ICU.  He required intubation for airway protection during cardioversion.  Cardiology consulted.  His clinical status got worse with progressive hypoxia, and hypotension.  Family updated about condition.  Decision made for DNR status and transition to comfort measures.  He expired on 07/05/2018 at 9:48 AM.  Final diagnoses: Septic shock Hospital acquired pneumonia with MSSA and Enterobacter E coli bacteremia with UTI Acute renal failure Hepatorenal syndrome CKD 3 Alcoholic liver cirrhosis Hepatic encephalopathy Ammonia elevated level COPD Chronic diastolic CHF Anemia of critical illness and chronic disease Candidal esophagitis with dysphagia Hypernatremia Urine retention Acute on chronic hypoxic and hypercapnic respiratory failure Supraventricular tachycardia Lactic acidosis Thrombocytopenia from sepsis and liver failure  Coralyn Helling, MD [facility] Pulmonary/Critical Care 08/04/2018, 6:09 PM

## 2020-05-18 IMAGING — CT CT HEAD W/O CM
4 series · 17 of 47 positions shown, 19 images · non-contrast
Comparison: None.

CLINICAL DATA: 67 y/o  F; hepatic encephalopathy.

EXAM:
CT HEAD WITHOUT CONTRAST
TECHNIQUE: Contiguous axial images were obtained from the base of the skull
through the vertex without intravenous contrast.

[Series 3: head without · axial · non-contrast · 0.41mm/px · z∈[-81,+39]mm · 7 of 32 slices shown, 9 images]
[im 4/32  brain]
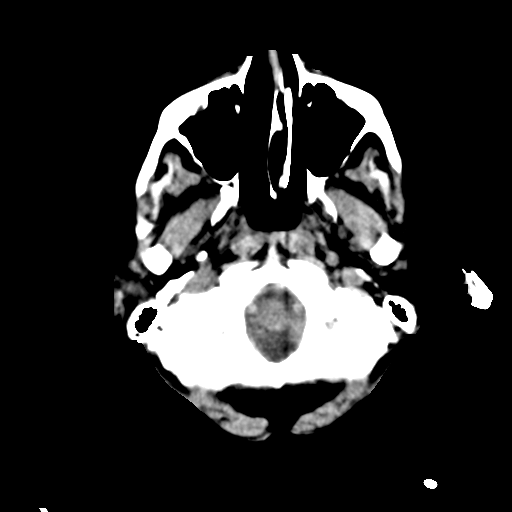
[im 4/32  bone]
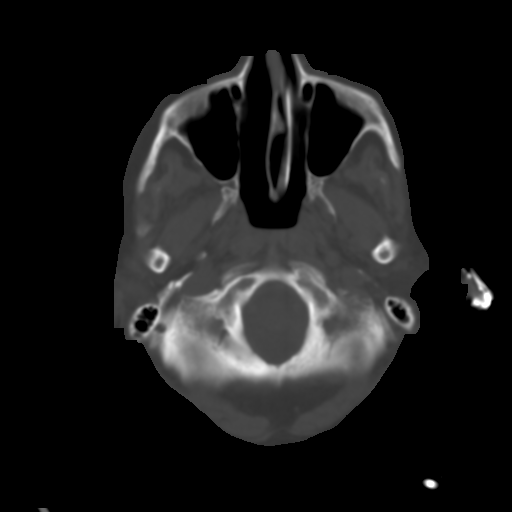
[im 8/32  brain]
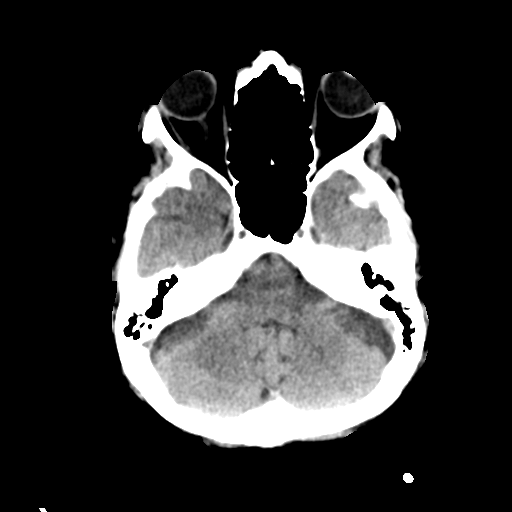
[im 12/32  brain]
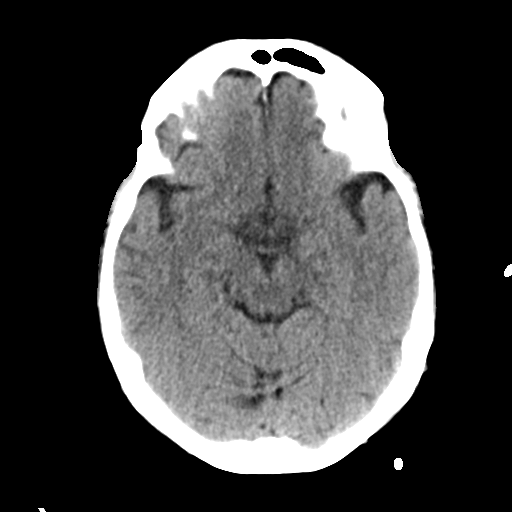
[im 16/32  brain]
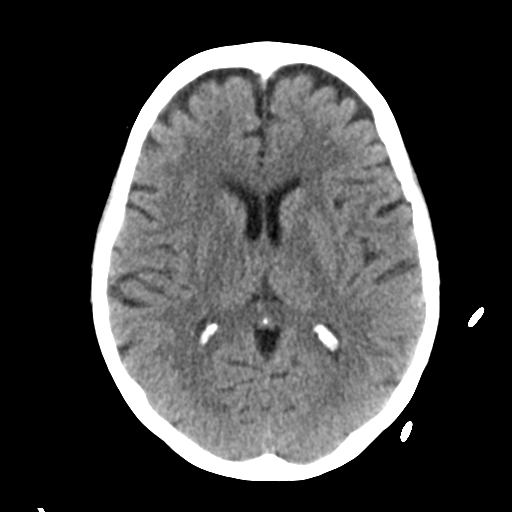
[im 20/32  brain]
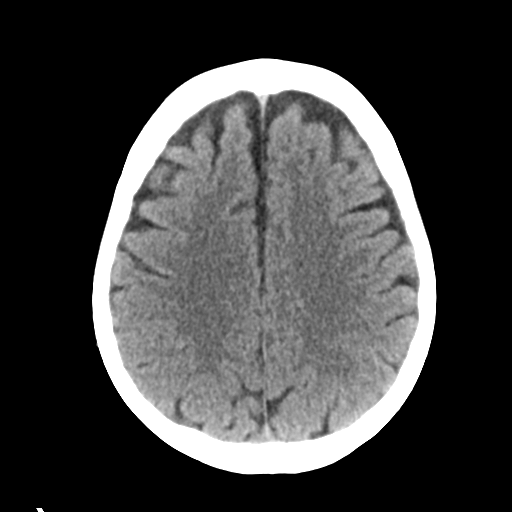
[im 20/32  bone]
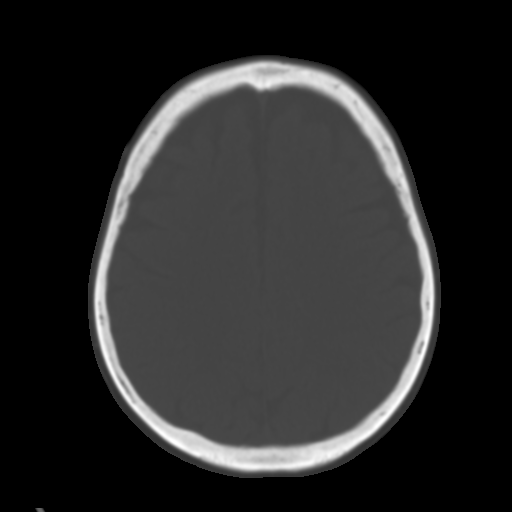
[im 24/32  brain]
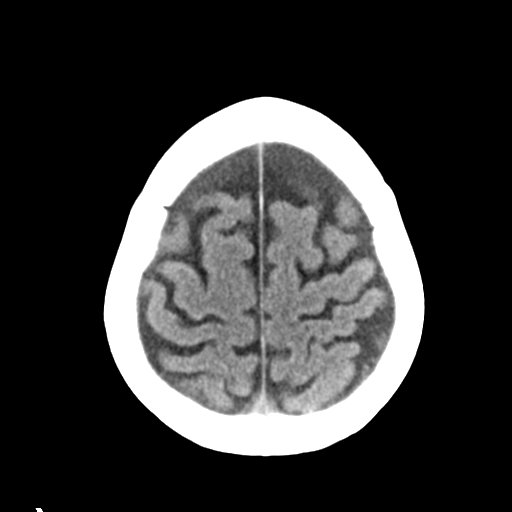
[im 28/32  brain]
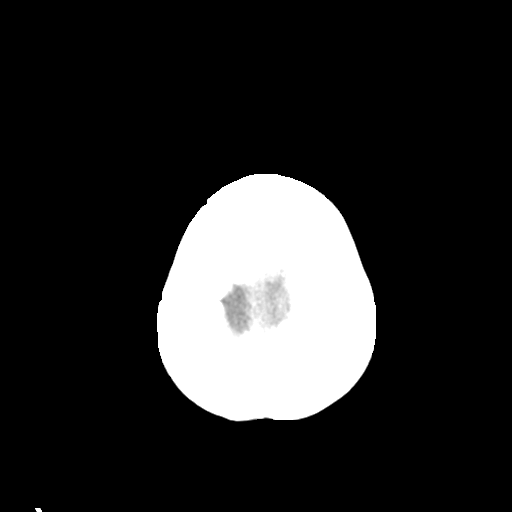

[Series 4: head bone · axial · 0.41mm/px · z∈[-82,-28]mm · 4 of 78 slices shown]
[im 8/78  bone]
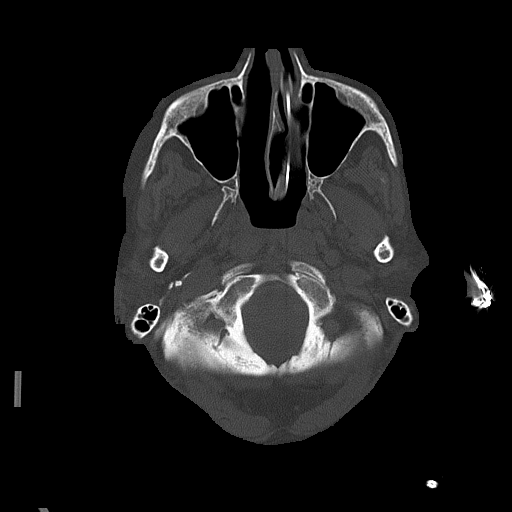
[im 16/78  bone]
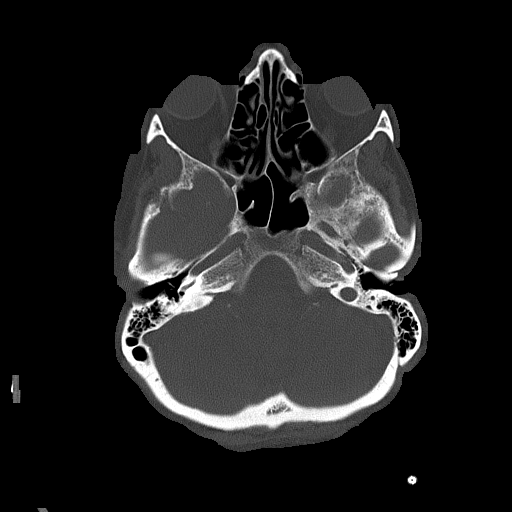
[im 24/78  bone]
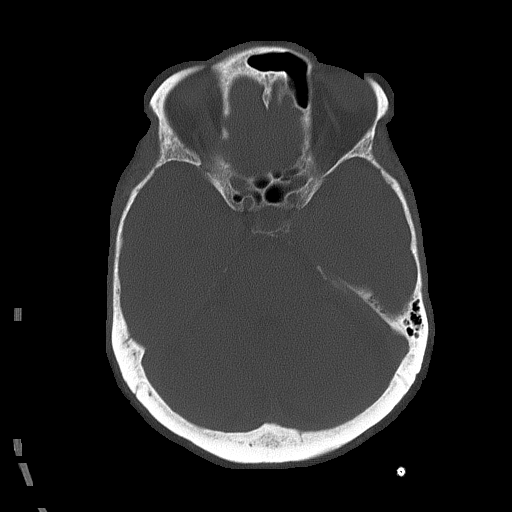
[im 35/78  bone]
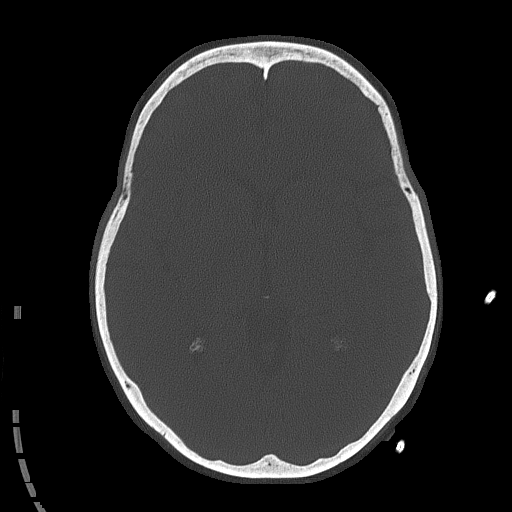

[Series 5: head without cor · coronal · non-contrast · 0.31mm/px · 3 of 67 slices shown]
[im 23/67  brain]
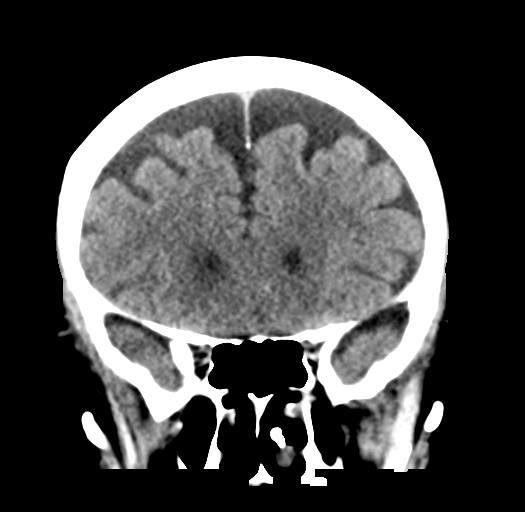
[im 30/67  brain]
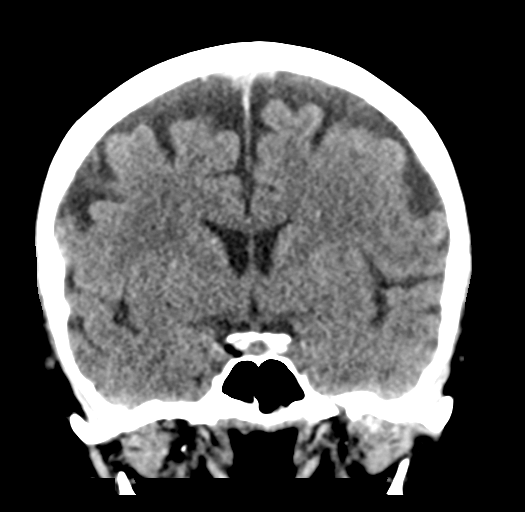
[im 37/67  brain]
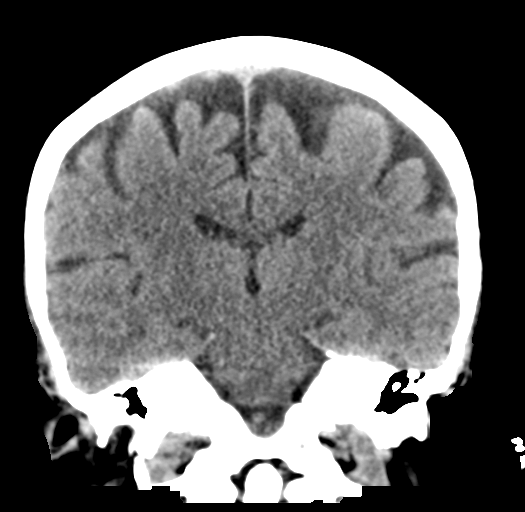

[Series 6: head without sag · sagittal · non-contrast · 0.33mm/px · 3 of 59 slices shown]
[im 20/59  brain]
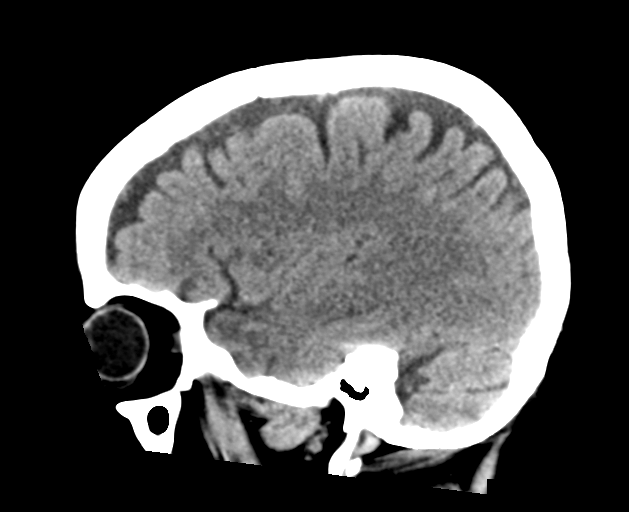
[im 30/59  brain]
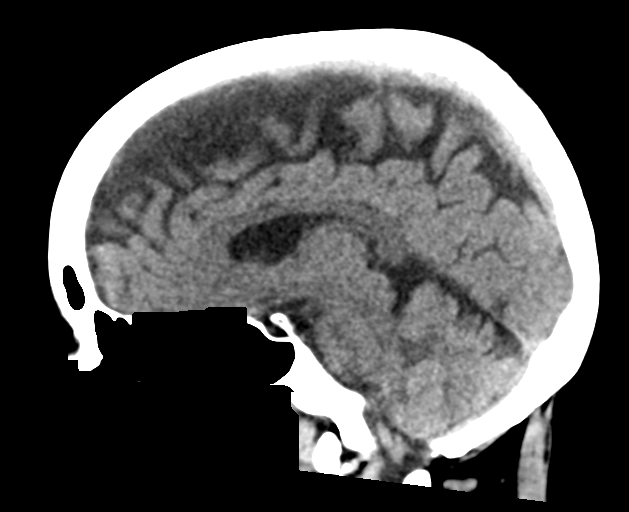
[im 39/59  brain]
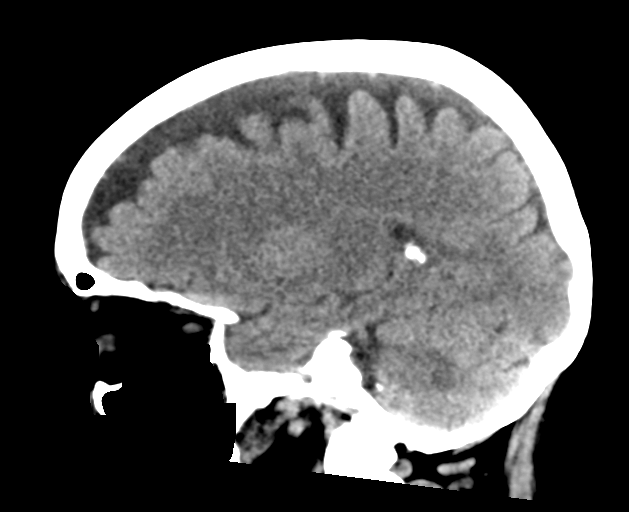

[17 of 47 positions shown; findings below may reference images not displayed]

FINDINGS: Brain: No evidence of acute infarction, hemorrhage, hydrocephalus,
extra-axial collection or mass lesion/mass effect. Few nonspecific
white matter hypodensities are compatible with mild chronic
microvascular ischemic changes. Mild volume loss of the brain.

Vascular: No hyperdense vessel or unexpected calcification.

Skull: Normal. Negative for fracture or focal lesion.

Sinuses/Orbits: No acute finding.

Other: Right intra-ocular lens replacement.
IMPRESSION: No acute intracranial abnormality identified. Mild chronic
microvascular ischemic changes and volume loss of the brain.
